# Patient Record
Sex: Female | Born: 1935
Health system: Southern US, Community
[De-identification: ages and names within clinical notes are randomized; demographics above are authoritative.]

## PROBLEM LIST (undated history)

## (undated) DIAGNOSIS — F419 Anxiety disorder, unspecified: Secondary | ICD-10-CM

## (undated) DIAGNOSIS — I2510 Atherosclerotic heart disease of native coronary artery without angina pectoris: Secondary | ICD-10-CM

## (undated) DIAGNOSIS — E039 Hypothyroidism, unspecified: Secondary | ICD-10-CM

## (undated) DIAGNOSIS — K469 Unspecified abdominal hernia without obstruction or gangrene: Secondary | ICD-10-CM

## (undated) DIAGNOSIS — K862 Cyst of pancreas: Secondary | ICD-10-CM

## (undated) DIAGNOSIS — N189 Chronic kidney disease, unspecified: Secondary | ICD-10-CM

## (undated) DIAGNOSIS — M5430 Sciatica, unspecified side: Secondary | ICD-10-CM

## (undated) DIAGNOSIS — I10 Essential (primary) hypertension: Secondary | ICD-10-CM

## (undated) DIAGNOSIS — R519 Headache, unspecified: Secondary | ICD-10-CM

## (undated) DIAGNOSIS — M1990 Unspecified osteoarthritis, unspecified site: Secondary | ICD-10-CM

## (undated) HISTORY — PX: WHIPPLE PROCEDURE: SHX2667

## (undated) HISTORY — DX: Atherosclerotic heart disease of native coronary artery without angina pectoris: I25.10

## (undated) HISTORY — PX: HERNIA REPAIR: SHX51

## (undated) HISTORY — DX: Cyst of pancreas: K86.2

## (undated) HISTORY — PX: HAND SURGERY: SHX662

## (undated) HISTORY — PX: FOOT SURGERY: SHX648

## (undated) HISTORY — DX: Sciatica, unspecified side: M54.30

---

## 1998-03-07 ENCOUNTER — Ambulatory Visit (HOSPITAL_COMMUNITY): Admission: RE | Admit: 1998-03-07 | Discharge: 1998-03-07 | Payer: Self-pay | Admitting: *Deleted

## 1998-03-25 ENCOUNTER — Other Ambulatory Visit: Admission: RE | Admit: 1998-03-25 | Discharge: 1998-03-25 | Payer: Self-pay | Admitting: *Deleted

## 1998-03-27 ENCOUNTER — Ambulatory Visit (HOSPITAL_COMMUNITY): Admission: RE | Admit: 1998-03-27 | Discharge: 1998-03-27 | Payer: Self-pay | Admitting: Family Medicine

## 1999-03-07 ENCOUNTER — Other Ambulatory Visit: Admission: RE | Admit: 1999-03-07 | Discharge: 1999-03-07 | Payer: Self-pay | Admitting: *Deleted

## 1999-04-01 ENCOUNTER — Encounter: Payer: Self-pay | Admitting: *Deleted

## 1999-04-01 ENCOUNTER — Ambulatory Visit (HOSPITAL_COMMUNITY): Admission: RE | Admit: 1999-04-01 | Discharge: 1999-04-01 | Payer: Self-pay | Admitting: *Deleted

## 1999-07-03 ENCOUNTER — Other Ambulatory Visit: Admission: RE | Admit: 1999-07-03 | Discharge: 1999-07-03 | Payer: Self-pay | Admitting: *Deleted

## 1999-07-03 ENCOUNTER — Encounter (INDEPENDENT_AMBULATORY_CARE_PROVIDER_SITE_OTHER): Payer: Self-pay

## 2000-01-19 ENCOUNTER — Encounter: Admission: RE | Admit: 2000-01-19 | Discharge: 2000-04-18 | Payer: Self-pay | Admitting: Anesthesiology

## 2000-03-23 ENCOUNTER — Other Ambulatory Visit: Admission: RE | Admit: 2000-03-23 | Discharge: 2000-03-23 | Payer: Self-pay | Admitting: *Deleted

## 2000-03-24 ENCOUNTER — Encounter: Payer: Self-pay | Admitting: *Deleted

## 2000-03-24 ENCOUNTER — Encounter: Admission: RE | Admit: 2000-03-24 | Discharge: 2000-03-24 | Payer: Self-pay | Admitting: *Deleted

## 2000-04-01 ENCOUNTER — Encounter: Payer: Self-pay | Admitting: *Deleted

## 2000-04-01 ENCOUNTER — Ambulatory Visit (HOSPITAL_COMMUNITY): Admission: RE | Admit: 2000-04-01 | Discharge: 2000-04-01 | Payer: Self-pay | Admitting: *Deleted

## 2001-01-18 ENCOUNTER — Ambulatory Visit (HOSPITAL_BASED_OUTPATIENT_CLINIC_OR_DEPARTMENT_OTHER): Admission: RE | Admit: 2001-01-18 | Discharge: 2001-01-19 | Payer: Self-pay | Admitting: Orthopedic Surgery

## 2001-03-24 ENCOUNTER — Other Ambulatory Visit: Admission: RE | Admit: 2001-03-24 | Discharge: 2001-03-24 | Payer: Self-pay | Admitting: *Deleted

## 2001-04-21 ENCOUNTER — Ambulatory Visit (HOSPITAL_COMMUNITY): Admission: RE | Admit: 2001-04-21 | Discharge: 2001-04-21 | Payer: Self-pay | Admitting: *Deleted

## 2001-04-21 ENCOUNTER — Encounter: Payer: Self-pay | Admitting: *Deleted

## 2002-03-27 ENCOUNTER — Other Ambulatory Visit: Admission: RE | Admit: 2002-03-27 | Discharge: 2002-03-27 | Payer: Self-pay | Admitting: Obstetrics & Gynecology

## 2002-04-24 ENCOUNTER — Ambulatory Visit (HOSPITAL_COMMUNITY): Admission: RE | Admit: 2002-04-24 | Discharge: 2002-04-24 | Payer: Self-pay | Admitting: *Deleted

## 2002-04-24 ENCOUNTER — Encounter: Payer: Self-pay | Admitting: *Deleted

## 2002-06-26 ENCOUNTER — Encounter: Payer: Self-pay | Admitting: Emergency Medicine

## 2002-06-26 ENCOUNTER — Emergency Department (HOSPITAL_COMMUNITY): Admission: EM | Admit: 2002-06-26 | Discharge: 2002-06-26 | Payer: Self-pay | Admitting: Emergency Medicine

## 2003-03-29 ENCOUNTER — Other Ambulatory Visit: Admission: RE | Admit: 2003-03-29 | Discharge: 2003-03-29 | Payer: Self-pay | Admitting: Obstetrics and Gynecology

## 2003-05-09 ENCOUNTER — Ambulatory Visit (HOSPITAL_COMMUNITY): Admission: RE | Admit: 2003-05-09 | Discharge: 2003-05-09 | Payer: Self-pay | Admitting: *Deleted

## 2003-05-09 ENCOUNTER — Encounter: Payer: Self-pay | Admitting: *Deleted

## 2004-04-14 ENCOUNTER — Ambulatory Visit (HOSPITAL_COMMUNITY): Admission: RE | Admit: 2004-04-14 | Discharge: 2004-04-14 | Payer: Self-pay | Admitting: *Deleted

## 2004-05-09 ENCOUNTER — Ambulatory Visit (HOSPITAL_COMMUNITY): Admission: RE | Admit: 2004-05-09 | Discharge: 2004-05-09 | Payer: Self-pay | Admitting: *Deleted

## 2005-05-11 ENCOUNTER — Ambulatory Visit (HOSPITAL_COMMUNITY): Admission: RE | Admit: 2005-05-11 | Discharge: 2005-05-11 | Payer: Self-pay | Admitting: *Deleted

## 2006-05-13 ENCOUNTER — Ambulatory Visit (HOSPITAL_COMMUNITY): Admission: RE | Admit: 2006-05-13 | Discharge: 2006-05-13 | Payer: Self-pay | Admitting: *Deleted

## 2007-02-17 ENCOUNTER — Encounter (INDEPENDENT_AMBULATORY_CARE_PROVIDER_SITE_OTHER): Payer: Self-pay | Admitting: *Deleted

## 2007-02-17 ENCOUNTER — Ambulatory Visit (HOSPITAL_COMMUNITY): Admission: RE | Admit: 2007-02-17 | Discharge: 2007-02-17 | Payer: Self-pay | Admitting: Gastroenterology

## 2007-02-17 ENCOUNTER — Encounter: Payer: Self-pay | Admitting: Gastroenterology

## 2007-03-02 ENCOUNTER — Ambulatory Visit: Payer: Self-pay | Admitting: Gastroenterology

## 2007-05-16 ENCOUNTER — Ambulatory Visit (HOSPITAL_COMMUNITY): Admission: RE | Admit: 2007-05-16 | Discharge: 2007-05-16 | Payer: Self-pay | Admitting: Family Medicine

## 2007-09-09 ENCOUNTER — Emergency Department (HOSPITAL_COMMUNITY): Admission: EM | Admit: 2007-09-09 | Discharge: 2007-09-09 | Payer: Self-pay | Admitting: Emergency Medicine

## 2008-05-16 ENCOUNTER — Ambulatory Visit (HOSPITAL_COMMUNITY): Admission: RE | Admit: 2008-05-16 | Discharge: 2008-05-16 | Payer: Self-pay | Admitting: Family Medicine

## 2008-06-27 ENCOUNTER — Encounter: Admission: RE | Admit: 2008-06-27 | Discharge: 2008-06-27 | Payer: Self-pay | Admitting: Family Medicine

## 2008-11-30 ENCOUNTER — Encounter: Admission: RE | Admit: 2008-11-30 | Discharge: 2008-11-30 | Payer: Self-pay | Admitting: Family Medicine

## 2009-05-08 ENCOUNTER — Encounter: Admission: RE | Admit: 2009-05-08 | Discharge: 2009-05-08 | Payer: Self-pay | Admitting: Family Medicine

## 2009-05-08 ENCOUNTER — Encounter: Payer: Self-pay | Admitting: Gastroenterology

## 2009-05-17 ENCOUNTER — Ambulatory Visit (HOSPITAL_COMMUNITY): Admission: RE | Admit: 2009-05-17 | Discharge: 2009-05-17 | Payer: Self-pay | Admitting: Family Medicine

## 2009-06-18 ENCOUNTER — Telehealth (INDEPENDENT_AMBULATORY_CARE_PROVIDER_SITE_OTHER): Payer: Self-pay | Admitting: *Deleted

## 2009-07-23 ENCOUNTER — Encounter (INDEPENDENT_AMBULATORY_CARE_PROVIDER_SITE_OTHER): Payer: Self-pay | Admitting: *Deleted

## 2009-07-23 ENCOUNTER — Ambulatory Visit: Payer: Self-pay | Admitting: Gastroenterology

## 2009-07-23 DIAGNOSIS — R933 Abnormal findings on diagnostic imaging of other parts of digestive tract: Secondary | ICD-10-CM | POA: Insufficient documentation

## 2009-07-31 ENCOUNTER — Encounter: Admission: RE | Admit: 2009-07-31 | Discharge: 2009-07-31 | Payer: Self-pay | Admitting: Family Medicine

## 2009-08-08 ENCOUNTER — Encounter: Payer: Self-pay | Admitting: Gastroenterology

## 2009-08-08 ENCOUNTER — Ambulatory Visit: Payer: Self-pay | Admitting: Gastroenterology

## 2009-08-08 ENCOUNTER — Ambulatory Visit (HOSPITAL_COMMUNITY): Admission: RE | Admit: 2009-08-08 | Discharge: 2009-08-08 | Payer: Self-pay | Admitting: Gastroenterology

## 2009-08-26 ENCOUNTER — Ambulatory Visit: Payer: Self-pay | Admitting: Gastroenterology

## 2009-09-12 ENCOUNTER — Encounter: Payer: Self-pay | Admitting: Gastroenterology

## 2009-10-03 ENCOUNTER — Telehealth (INDEPENDENT_AMBULATORY_CARE_PROVIDER_SITE_OTHER): Payer: Self-pay | Admitting: *Deleted

## 2009-10-08 ENCOUNTER — Encounter: Payer: Self-pay | Admitting: Gastroenterology

## 2010-06-06 ENCOUNTER — Ambulatory Visit (HOSPITAL_COMMUNITY): Admission: RE | Admit: 2010-06-06 | Discharge: 2010-06-06 | Payer: Self-pay | Admitting: Obstetrics

## 2010-07-08 ENCOUNTER — Encounter: Payer: Self-pay | Admitting: Gastroenterology

## 2010-08-13 ENCOUNTER — Encounter: Payer: Self-pay | Admitting: Gastroenterology

## 2010-08-31 ENCOUNTER — Encounter: Payer: Self-pay | Admitting: Gastroenterology

## 2010-09-12 ENCOUNTER — Encounter: Payer: Self-pay | Admitting: Gastroenterology

## 2010-10-21 ENCOUNTER — Encounter: Payer: Self-pay | Admitting: Gastroenterology

## 2010-10-26 ENCOUNTER — Encounter: Payer: Self-pay | Admitting: Family Medicine

## 2010-10-30 ENCOUNTER — Emergency Department (HOSPITAL_BASED_OUTPATIENT_CLINIC_OR_DEPARTMENT_OTHER)
Admission: EM | Admit: 2010-10-30 | Discharge: 2010-10-30 | Payer: Self-pay | Source: Home / Self Care | Admitting: Emergency Medicine

## 2010-10-30 LAB — COMPREHENSIVE METABOLIC PANEL
ALT: 31 U/L (ref 0–35)
AST: 30 U/L (ref 0–37)
Albumin: 3.9 g/dL (ref 3.5–5.2)
Alkaline Phosphatase: 97 U/L (ref 39–117)
BUN: 19 mg/dL (ref 6–23)
CO2: 27 mEq/L (ref 19–32)
Calcium: 9.6 mg/dL (ref 8.4–10.5)
Chloride: 99 mEq/L (ref 96–112)
Creatinine, Ser: 0.8 mg/dL (ref 0.4–1.2)
GFR calc Af Amer: >60 mL/min (ref >60)
GFR calc non Af Amer: >60 mL/min (ref >60)
Glucose, Bld: 110 mg/dL — ABNORMAL HIGH (ref 70–99)
Potassium: 4.2 mEq/L (ref 3.5–5.1)
Sodium: 136 mEq/L (ref 135–145)
Total Bilirubin: 0.4 mg/dL (ref 0.3–1.2)
Total Protein: 7.1 g/dL (ref 6.0–8.3)

## 2010-10-30 LAB — CBC
HCT: 32 % — ABNORMAL LOW (ref 36.0–46.0)
Hemoglobin: 10.6 g/dL — ABNORMAL LOW (ref 12.0–15.0)
MCH: 27.6 pg (ref 26.0–34.0)
MCHC: 33.1 g/dL (ref 30.0–36.0)
MCV: 83.3 fL (ref 78.0–100.0)
Platelets: 283 10*3/uL (ref 150–400)
RBC: 3.84 MIL/uL — ABNORMAL LOW (ref 3.87–5.11)
RDW: 14.1 % (ref 11.5–15.5)
WBC: 9.9 10*3/uL (ref 4.0–10.5)

## 2010-10-30 LAB — URINALYSIS, ROUTINE W REFLEX MICROSCOPIC
Bilirubin Urine: NEGATIVE
Hgb urine dipstick: NEGATIVE
Ketones, ur: NEGATIVE mg/dL
Nitrite: NEGATIVE
Protein, ur: NEGATIVE mg/dL
Specific Gravity, Urine: 1.011 (ref 1.005–1.030)
Urine Glucose, Fasting: NEGATIVE mg/dL
Urobilinogen, UA: 0.2 mg/dL (ref 0.0–1.0)
pH: 6.5 (ref 5.0–8.0)

## 2010-10-30 LAB — DIFFERENTIAL
Basophils Absolute: 0 10*3/uL (ref 0.0–0.1)
Basophils Relative: 0 % (ref 0–1)
Eosinophils Absolute: 0.1 10*3/uL (ref 0.0–0.7)
Eosinophils Relative: 1 % (ref 0–5)
Lymphocytes Relative: 27 % (ref 12–46)
Lymphs Abs: 2.7 10*3/uL (ref 0.7–4.0)
Monocytes Absolute: 0.7 10*3/uL (ref 0.1–1.0)
Monocytes Relative: 7 % (ref 3–12)
Neutro Abs: 6.3 10*3/uL (ref 1.7–7.7)
Neutrophils Relative %: 64 % (ref 43–77)

## 2010-10-30 LAB — LIPASE, BLOOD: Lipase: 73 U/L (ref 23–300)

## 2010-11-04 NOTE — Procedures (Signed)
Summary: Endoscopic Ultrasound  Patient: Denise Hoffman Note: All result statuses are Final unless otherwise noted.  Tests: (1) Endoscopic Ultrasound (EUS)  EUS Endoscopic Ultrasound                             DONE     [facility]     [address] [place], [place]  [address]           ENDOSCOPIC ULTRASOUND PROCEDURE REPORT           PATIENT:  Denise Hoffman, Denise Hoffman  MR#:  [id number]     BIRTHDATE:  1935-10-14  GENDER:  female           ENDOSCOPIST:  Rachael Fee, MD     REFERRED BY:           PROCEDURE DATE:  08/08/2009     PROCEDURE:  Upper EUS w/FNA     ASA CLASS:  Class II     INDICATIONS:  Pancreatic cyst, found incidentally when undergoing     imaging for AAA (4.8cm by 2.8 cm cyst). EUS 2008:Single bilobed     cyst in region of head/uncinate pancreas measuring 2.8cm     maximally. There were no septations and no internal debris. No     associated solid masses. FNA fluid analysis CEA 262, Amylase     13,000, cytology negative. August 2010 CT scan for right lower     quadrant pain found a cyst, possibly very slight increase in size     in two-year interval           MEDICATIONS:   Fentanyl 100 mcg IV, Versed 8 mg IV, Cipro 400mg  IV           DESCRIPTION OF PROCEDURE:   After the risks benefits and     alternatives of the procedure were  explained, informed consent     was obtained. The patient was then placed in the left, lateral,     decubitus postion and IV sedation was administered. Throughout the     procedure, the patient's blood pressure, pulse and oxygen     saturations were monitored continuously.  Under direct     visualization, the [id number] ([id number]) endoscope was introduced     through the mouth and advanced to the duodenum.  Water was used as     necessary to provide an acoustic interface.  Upon completion of     the imaging, water was removed and the patient was sent to the     recovery room in satisfactory condition.        Endoscopic findings:     1. Normal esophagus     2. Normal stomach     3. Normal duodenum           EUS findings:     1. Bilobed cyst in head/uncinate pancreas  that measured 5cm in     maximum dimension. The cyst had a single thin septation but was     otherwise anechoic There were no obvious associated nodules or     solid masses.  The uncinate branch of pancreatic duct may     communicate with the cyst, but this is not ideally visualized.     The cyst fluid was aspirated using a single pass of a 22 gauge Echo     Tip EUS FNA needle.  FNA yielded 9cc of clear, thin fluid.  2. Pancreatic parenchyma was otherwise normal throughout gland.     3. Main pancreatic duct was normal, non-dilated     4. CBD was normal, non-dilated and without filling defects     5. Gallbladder was normal     6. Limited views of liver, spleen, portal and splenic vessels were     all normal.           Impression:     5cm, bilobed cyst in head/uncinate pancreas.  Although it is 5cm,     this cyst has no alarm features such as associated nodules,     masses, pancreatic or biliary duct dilation.  9cc of fluid was     aspirated from the cyst, sent for CEA, amylase and cytology.     Final recommendations pending the results of cyst fluid testing.     She will complete 3 days of twice daily cipro.           ______________________________     Rachael Fee, MD           cc: Catha Gosselin, MD;  Dr. Algie Coffer           n.     eSIGNED:   Rachael Fee at 08/08/2009 08:20 AM           Vito Berger, [id number]  Note: An exclamation mark (!) indicates a result that was not dispersed into the flowsheet. Document Creation Date: 08/08/2009 8:20 AM _______________________________________________________________________  (1) Order result status: Final Collection or observation date-time: 08/08/2009 07:45 Requested date-time:  Receipt date-time:  Reported date-time:  Referring Physician:   Ordering  Physician: Rob Bunting [phone]) Specimen Source:  Source: Launa Grill Order Number: [id number] Lab site:

## 2010-11-04 NOTE — Procedures (Signed)
Summary: ENDO/EUS prep/[facility]  ENDO/EUS prep/ [facility]   Imported By: Lester [place] 07/25/2009 08:13:08  _____________________________________________________________________  External Attachment:    Type:   Image     Comment:   External Document

## 2010-11-04 NOTE — Letter (Signed)
Summary: General Surgery/[facility]  General Surgery/[facility]   Imported By: Lester [place] 07/17/2010 09:18:19  _____________________________________________________________________  External Attachment:    Type:   Image     Comment:   External Document  Appended Document: General Surgery/[facility] please call her.  she needs rov with me to discuss colonoscopy that was recommended by Dr. Jola Babinski at [facility] (for elevated CEA level)  Appended Document: General Surgery/[facility] Dr Bosie Clos did the Colon on Tues.

## 2010-11-04 NOTE — Progress Notes (Signed)
Summary: Appt  Phone Note Outgoing Call Call back at [facility] Phone [phone]   Call placed by: Chales Abrahams CMA Duncan Dull),  June 18, 2009 4:38 PM Summary of Call: Called and scheduled appt with pt to discuss CT scan results with Dr Christella Hartigan Initial call taken by: Chales Abrahams CMA Duncan Dull),  June 18, 2009 4:38 PM

## 2010-11-04 NOTE — Consult Note (Signed)
Summary: [facility]   Imported By: Lester [place] 09/26/2009 08:05:51  _____________________________________________________________________  External Attachment:    Type:   Image     Comment:   External Document

## 2010-11-04 NOTE — Letter (Signed)
Summary: Gen Surgery Clinic [facility]  Gen Surgery Clinic Note/[facility]   Imported By: Sherian Rein 10/17/2009 09:11:38  _____________________________________________________________________  External Attachment:    Type:   Image     Comment:   External Document

## 2010-11-04 NOTE — Progress Notes (Signed)
Summary: Records faxed to [facility]  Records faxed to [facility] [phone] Attn: Ms. Cline Crock. 11 pages sent for patient's appointment on Monday January 3,2011. Dena Chavis  October 03, 2009 11:25 AM

## 2010-11-04 NOTE — Procedures (Signed)
Summary: EUS   EUS  Procedure date:  02/17/2007  Findings:      Location: [facility]   Patient Name: Denise Hoffman, Denise Hoffman MRN: [id number] Procedure Procedures: Panendoscopy with EUSCPT: 43259.   with Fine Needle Aspiration Personnel: Endoscopist: Rachael Fee, MD.  Referred By: Catha Gosselin, MD.  Exam Location: Exam performed in Endoscopy Suite. Outpatient  Patient Consent: Procedure, Alternatives, Risks and Benefits discussed, consent obtained, from patient. Consent was obtained by the RN.  Indications  Assessment: incidentally found cyst in head/uncinate pancreas (ultrasound initially done to check for AAA), no solid mass in pancreas  on CT.  History  Current Medications: Patient is not currently taking Coumadin.  Pre-Exam Physical: Performed Feb 17, 2007. Cardio-pulmonary exam, Abdominal exam, Mental status exam WNL.  Exam Exam: Images were taken.  Patient: ASA Classification: II. Tolerance: good.  Sedation Meds:  ~OBJECTIVE5Sedation Meds Patient assessed and found to be appropriate for moderate (conscious) sedation. Fentanyl 75 mcg. given IV. Versed 8 mg. given IV. Cetacaine Spray 2 sprays given aerosolized. Cipro 400 given IV.  Monitoring: BP and pulse monitoring done. Oximetry was used. Supplemental O2 given.  EUS Scopes: Radial Echoendoscope used Curvilinear Array Echoendoscope used   Comments: Endoscopic examination: 1. Normal esophagus. 2. Normal stomach. 3. Normal duodenum.  EUS examination: 1. Single bilobed cyst in region of head/uncinate pancreas measuring 2.8cm maximally.  There were no septations and no internal debris.  No associated solid masses.  This was sampled with 2 passes of 22guage Echo Tip EUS FNA needle using color Doppler to avoid vessels.  10cc clear, thin fluid was removed and sent for cytology, CEA and amylase.  The cyst was nearly completely aspirated. 2. Pancreatic parenchyma was normal throughout head, neck,  body and tail. 3. CBD was normal; non-dilated and without filling defects. 4. Main pancreatic duct was normal. 5. Gallbladder was normal. 6. No peripancreatic or celiac adenopathy. 7. Limited views of liver, spleen, splenic vessels, portal vessels were all normal. Assessment  Biliary/Pancreatic Abnormal examination, see findings above.  Comments: Single bilobed cyst in head/uncinate pancreas, aspirated by FNA.  10cc clear, thin fluid sent for CEA, amylase, cytology.  I suspect this is a serous cystadenoma, a condition without malignant potential. Events  Unplanned Intervention: No intervention was required.  Unplanned Events: There were no complications. Plans Comments: Await fluid analysis.  She will complete a 3 day course of cipro 500mg  BID to decrease risk of infection. This report was created from the original endoscopy report, which was reviewed and signed by the above listed endoscopist.

## 2010-11-04 NOTE — Letter (Signed)
Summary: [facility]  [facility]   Imported By: Sherian Rein 10/22/2009 10:27:53  _____________________________________________________________________  External Attachment:    Type:   Image     Comment:   External Document

## 2010-11-04 NOTE — Letter (Signed)
Summary: EGD Instructions  [facility]  [address] [place], [place] [address]   Phone: [phone]  Fax: [phone]       Denise Hoffman    [birth year]    MRN: [id number]       Procedure Day /Date:08/08/2009     Arrival Time: 630 am     Procedure Time:730 am     Location of Procedure:                     _X  _ [facility] ( Outpatient Registration)   PREPARATION FOR ENDOSCOPY   On11/4/10 THE DAY OF THE PROCEDURE:  1.   No solid foods, milk or milk products are allowed after midnight the night before your procedure.  2.   Do not drink anything colored red or purple.  Avoid juices with pulp.  No orange juice.  3.  You may drink clear liquids until 530 am , which is 2 hours before your procedure.                                                                                                CLEAR LIQUIDS INCLUDE: Water Jello Ice Popsicles Tea (sugar ok, no milk/cream) Powdered fruit flavored drinks Coffee (sugar ok, no milk/cream) Gatorade Juice: apple, white grape, white cranberry  Lemonade Clear bullion, consomm, broth Carbonated beverages (any kind) Strained chicken noodle soup Hard Candy   MEDICATION INSTRUCTIONS  Unless otherwise instructed, you should take regular prescription medications with a small sip of water as early as possible the morning of your procedure.  Diabetic patients - see separate instructions.               OTHER INSTRUCTIONS  You will need a responsible adult at least 73 years of age to accompany you and drive you home.   This person must remain in the waiting room during your procedure.  Wear loose fitting clothing that is easily removed.  Leave jewelry and other valuables at home.  However, you may wish to bring a book to read or an iPod/MP3 player to listen to music as you wait for your procedure to start.  Remove all body piercing jewelry and leave at home.  Total time from sign-in until  discharge is approximately 2-3 hours.  You should go home directly after your procedure and rest.  You can resume normal activities the day after your procedure.  The day of your procedure you should not:   Drive   Make legal decisions   Operate machinery   Drink alcohol   Return to work  You will receive specific instructions about eating, activities and medications before you leave.    The above instructions have been reviewed and explained to me by   _______________________    I fully understand and can verbalize these instructions _____________________________ Date 07/23/09

## 2010-11-04 NOTE — Miscellaneous (Signed)
Summary: rx  Clinical Lists Changes  Medications: Added new medication of CIPROFLOXACIN HCL 500 MG  TABS (CIPROFLOXACIN HCL) Take 1 twice a day for 3 days - Signed Rx of CIPROFLOXACIN HCL 500 MG  TABS (CIPROFLOXACIN HCL) Take 1 twice a day for 3 days;  #6 x 0;  Signed;  Entered by: Rachael Fee MD;  Authorized by: Rachael Fee MD;  Method used: Print then Give to Patient    Prescriptions: CIPROFLOXACIN HCL 500 MG  TABS (CIPROFLOXACIN HCL) Take 1 twice a day for 3 days  #6 x 0   Entered and Authorized by:   Rachael Fee MD   Signed by:   Rachael Fee MD on 08/08/2009   Method used:   Print then Give to Patient   RxID:   [id number]

## 2010-11-04 NOTE — Assessment & Plan Note (Signed)
Review of gastrointestinal problems: 1. Pancreatic cyst, found incidentally when undergoing imaging for AAA (4.8cm by 2.8 cm cyst).  EUS 2008:Single bilobed cyst in region of head/uncinate pancreas measuring 2.8cm maximally.  There were no septations and no internal debris. No associated solid masses.  FNA fluid analysis  CEA 262, Amylase 13,000, cytology negative... August 2010 CT scan for right lower quadrant pain found a cyst, possibly very slight increase in size in two-year interval.    October, 2010: Repeat endoscopic ultrasound with fine needle aspiration: morphologically the same however CEA was 584 ng/mL (high), amylase was 34 U/L (low), cytology negative.   History of Present Illness Visit Type: Follow-up Visit Primary GI MD: Rob Bunting MD Primary Provider: Catha Gosselin, MD Chief Complaint: 1-2 week follow-up History of Present Illness:      very pleasant 75 year old woman whom I last saw at the time of her endoscopic ultrasound2-3 weeks ago. The cyst fluid analysis came back pointing towards this cyst being a precancerous lesion, specifically the CEA level was quite high. See those results summarized above. She is here with her daughter and husband and we discussed referring her to a pancreatic surgeon to consider Whipple procedure. She is otherwise pretty healthy and I think she could withstand such a procedure.           Current Medications (verified): 1)  Metoprolol Succinate 200 Mg Xr24h-Tab (Metoprolol Succinate) .Marland Kitchen.. 1 By Mouth Two Times A Day 2)  Hydrochlorothiazide 25 Mg Tabs (Hydrochlorothiazide) .Marland Kitchen.. 1 Tablet By Mouth Once Daily 3)  Amitiza 24 Mcg Caps (Lubiprostone) .Marland Kitchen.. 1 Capsule By Mouth Once Daily 4)  Cozaar 50 Mg Tabs (Losartan Potassium) .Marland Kitchen.. 1 Tablet By Mouth Once Daily \\par  5)  Alprazolam 0.5 Mg Tabs (Alprazolam) .... Take By Mouth As Needed 6)  Zolpidem Tartrate 10 Mg Tabs (Zolpidem Tartrate) .... 1/2 Tablet By Mouth As Needed At Bedtime 7)  Calcium 1500  Mg Tabs (Calcium Carbonate) .Marland Kitchen.. 1 Tablet By Mouth By Mouth Two Times A Day 8)  Vitamin D 1000 Unit Tabs (Cholecalciferol) .Marland Kitchen.. 1 Tablet By Mouth Once Daily 9)  Aspirin 81 Mg Tbec (Aspirin) .Marland Kitchen.. 1 Tablet By Mouth Once Daily 10)  Hyomax-Sr 0.375 Mg Xr12h-Tab (Hyoscyamine Sulfate) .... Take 2 Daily As Needed 11)  Estrace 1 Mg Tabs (Estradiol) .Marland Kitchen.. 1 Tablet By Mouth Once Daily  Allergies (verified): 1)  ! Augmentin 2)  ! Sulfa 3)  ! Morphine 4)  ! Prednisone  Vital Signs:  Patient profile:   75 year old female Height:      63.5 inches Weight:      141.13 pounds BMI:     24.70 Pulse rate:   68 / minute Pulse rhythm:   regular BP sitting:   150 / 84  (left arm)  Vitals Entered By: Milford Cage NCMA (August 26, 2009 10:26 AM)  Physical Exam  Additional Exam:  Constitutional: generally well appearing Psychiatric: alert and oriented times 3 Abdomen: soft, non-tender, non-distended, normal bowel sounds    Impression & Recommendations:  Problem # 1:  Pancreatic cyst we will refer her to Dr. Donell Beers at [facility] to consider Whipple resection of this likely precancerous pancreatic cyst.  Patient Instructions: 1)  We will refer you to Dr. Almond Lint at [facility] to consider surgery (whipple) for the pancreatic cystic lesion in head of pancras. 2)  A copy of this information will be sent to Dr. Berniece Salines, [facility], [place]. 3)  The medication list was reviewed and reconciled.  All changed /  newly prescribed medications were explained.  A complete medication list was provided to the patient / caregiver.

## 2010-11-04 NOTE — Assessment & Plan Note (Signed)
Review of gastrointestinal problems: 1. Pancreatic cyst, found incidentally when undergoing imaging for AAA (4.8cm by 2.8 cm cyst).  EUS 2008:Single bilobed cyst in region of head/uncinate pancreas measuring 2.8cm maximally.  There were no septations and no internal debris. No associated solid masses.  FNA fluid analysis  CEA 262, Amylase 13,000, cytology negative... August 2010 CT scan for right lower quadrant pain found a cyst, possibly very slight increase in size in two-year interval    History of Present Illness Visit Type: Follow-up Visit Primary GI MD: Rob Bunting MD Chief Complaint: review CT results History of Present Illness:     very pleasant 75 year old woman whom I last saw over 2 years ago for endoscopic ultrasound of a pancreatic cyst that was incidentally found. See those results above. She has a tough time with constipation and IBS-like discomforts. She had recent right lower quadrant discomfort, this was evaluated with a CT scan. That CT scan showed a cystocele, also the pancreatic cyst in the uncinate process of the pancreas was seen again and possibly have grown slightly in size. It now measured up to 2.9 x 4.9 cm. She has no pancreatitis-like discomforts           Current Medications (verified): 1)  Metoprolol Succinate 200 Mg Xr24h-Tab (Metoprolol Succinate) .Marland Kitchen.. 1 By Mouth Two Times A Day 2)  Hydrochlorothiazide 25 Mg Tabs (Hydrochlorothiazide) .Marland Kitchen.. 1 Tablet By Mouth Once Daily 3)  Amitiza 24 Mcg Caps (Lubiprostone) .Marland Kitchen.. 1 Capsule By Mouth Once Daily 4)  Cozaar 50 Mg Tabs (Losartan Potassium) .Marland Kitchen.. 1 Tablet By Mouth Once Daily \\par  5)  Alprazolam 0.5 Mg Tabs (Alprazolam) .... Take By Mouth As Needed 6)  Zolpidem Tartrate 10 Mg Tabs (Zolpidem Tartrate) .... 1/2 Tablet By Mouth As Needed At Bedtime 7)  Calcium 1500 Mg Tabs (Calcium Carbonate) .Marland Kitchen.. 1 Tablet By Mouth By Mouth Two Times A Day 8)  Vitamin D 1000 Unit Tabs (Cholecalciferol) .Marland Kitchen.. 1 Tablet By Mouth Once  Daily 9)  Aspirin 81 Mg Tbec (Aspirin) .Marland Kitchen.. 1 Tablet By Mouth Once Daily  Allergies (verified): 1)  ! Augmentin 2)  ! Sulfa 3)  ! Morphine 4)  ! Prednisone  Vital Signs:  Patient profile:   75 year old female Height:      63.5 inches Weight:      144 pounds BMI:     25.20 Pulse rate:   60 / minute Pulse rhythm:   regular BP sitting:   126 / 84  (left arm)  Vitals Entered By: Milford Cage NCMA (July 23, 2009 8:37 AM)  Physical Exam  Additional Exam:  Constitutional: generally well appearing Psychiatric: alert and oriented times 3 Abdomen: soft, non-tender, non-distended, normal bowel sounds    Impression & Recommendations:  Problem # 1:  pancreatic cyst I do not think that her pancreatic cyst has anything to do with her right lower quadrant discomforts. Cyst fluid analysis from 2008 was somewhat equivocal with a slightly elevated CEA in the upper 200s but a very elevated amylase. Cytology was negative. I think repeating cyst fluid analysis with endoscopic ultrasound and fine-needle aspiration is a good idea this point. There are more available tests to analyze the fluid today than there were 2 years ago.  my suspicion of underlying malignancy is very low and I do not think that this finding should keep her from having her cystocele fixed operatively.  Patient Instructions: 1)  You will be scheduled for an endoscopic ultrasound procedure (upper EUS, radial +/- linear; 60  min). 2)  A copy of this information will be sent to Dr. Catha Gosselin, Dr. Algie Coffer. 3)  The medication list was reviewed and reconciled.  All changed / newly prescribed medications were explained.  A complete medication list was provided to the patient / caregiver.  Appended Document: Orders Update/EUS    Clinical Lists Changes  Problems: Added new problem of NONSPECIFIC ABN FINDING RAD & OTH EXAM GI TRACT (ICD-793.4) Orders: Added new Test order of EUS-Upper (EUS-Upper) - Signed      Appended  Document: Orders Update/[facility]    Clinical Lists Changes  Orders: Added new Test order of [facility] ([facility]) - Signed

## 2010-11-06 NOTE — Letter (Signed)
Summary: Discharge Summary / [facility]  Discharge Summary / [facility]   Imported By: Lennie Odor 09/17/2010 16:24:12  _____________________________________________________________________  External Attachment:    Type:   Image     Comment:   External Document

## 2010-11-06 NOTE — Letter (Signed)
Summary: General Surgery Clinic Note/[facility]  General Surgery Clinic Note/[facility]   Imported By: Sherian Rein 10/21/2010 11:12:16  _____________________________________________________________________  External Attachment:    Type:   Image     Comment:   External Document

## 2010-11-12 NOTE — Letter (Signed)
Summary: General Surgery Clinic Note/[facility]  General Surgery Clinic Note/[facility]   Imported By: Sherian Rein 11/07/2010 15:08:06  _____________________________________________________________________  External Attachment:    Type:   Image     Comment:   External Document

## 2010-11-12 NOTE — Procedures (Signed)
Summary: Pancreaticoduodenectomy/[facility]  Pancreaticoduodenectomy/[facility]   Imported By: Sherian Rein 11/07/2010 15:17:51  _____________________________________________________________________  External Attachment:    Type:   Image     Comment:   External Document

## 2011-01-07 LAB — MISCELLANEOUS TEST

## 2011-01-07 LAB — AMYLASE, BODY FLUID: Amylase, Fluid: 34 U/L

## 2011-02-20 NOTE — Procedures (Signed)
[facility]  Patient:    Denise Hoffman, Denise Hoffman                 MRN: [id number] Proc. Date: 02/02/00 Adm. Date:  [id number] Attending:  Thyra Breed CC:         John L. Dorothyann Gibbs, M.D.                           Procedure Report  PROCEDURE:  Lumbar epidural steroid injection.  DIAGNOSIS:  Spondylolisthesis with lumbar spinal stenosis.  INTERVAL HISTORY:  The patient has noted marked improvement after her first two injections.  She is here for last in a series of lumbar epidural steroid injections.  EXAMINATION:  Blood pressure was 169/95, heart rate 84, respiratory rate 16, O2 saturations 96%, pain level was 0 out of 10 and temperature was 97.1.  Her back shows good healing from her previous injection site.  Neuro examination is grossly unchanged.  PROCEDURE:  After informed consent was obtained, the patient was placed in the sitting position and monitored.  Her back was prepped with Betadine x 3.  A skin wheal was raised at the L4-5 interspace with 1% lidocaine.  A 20-gauge Tuohy needle was introduced into the lumbar epidural space to a loss of resistance to preservative-free normal saline.  There was no CSF nor blood. Medrol 80 mg and 10 mL preservative-free normal saline was gently injected. The needle was flushed with preservative-free normal saline and removed intact.  POSTPROCEDURAL CONDITION:  Stable.  DISCHARGE INSTRUCTIONS: 1. Resume previous diet. 2. Limitation of activities per instruction sheet. 3. Continue on current medications. 4. The patient plans to follow up with Dr. Priscille Kluver. DD:  02/02/00 TD:  02/02/00 Job: [id number]

## 2011-02-20 NOTE — Op Note (Signed)
. [facility]  Patient:    Denise Hoffman, Denise Hoffman                 MRN: [id number] Proc. Date: 01/18/01 Adm. Date:  [id number] Attending:  Ronne Binning                           Operative Report  PREOPERATIVE DIAGNOSES:  Carpal tunnel syndrome, carpometacarpal arthritis, left hand.  POSTOPERATIVE DIAGNOSES:  Carpal tunnel syndrome, carpometacarpal arthritis, left hand.  PROCEDURES:  Left carpal tunnel release with suspensionplasty, left thumb.  SURGEON:  Nicki Reaper, M.D.  ASSISTANT:  Joaquin Courts, R.N.  ANESTHESIA:  General.  ANESTHESIOLOGIST:  Halford Decamp, M.D.  CLINICAL NOTE:  The patient is a 75 year old female with a history of carpal tunnel syndrome and significant CMC arthritis, not responsive to conservative treatment.  DESCRIPTION OF PROCEDURE:  The patient was brought to the operating room, where a general anesthetic was carried out without difficulty.  She was prepped and draped using Betadine scrub and solution with the left arm free. The limb was exsanguinated with an Esmarch bandage.  The tourniquet placed high in the arm was inflated to 250 mmHg.  A longitudinal incision was made in the palm, carried down through subcutaneous tissue.  Bleeders were electrocauterized.  The superficial palmar arch was identified, the flexor tendon into the ring and little finger identified to the ulnar side of the median nerve.  The carpal retinaculum was incised with sharp dissection.  A right angle and Sewell retractor were placed between skin and forearm fascia. The fascia was released for approximately 3 cm proximal to the wrist crease under direct vision.  The canal was explored, and no further lesions were identified.  The wound was irrigated.  The skin was closed with interrupted 5-0 nylon sutures.  A separate incision was made, a hockey stick over the base of the thumb metacarpal, carried proximally along the first  dorsal compartment, carried down through subcutaneous tissue.  Bleeders were again electrocauterized, neurovascular structures protected, the dissection carried down to the capsule.  The radial artery was identified and protected.  An incision was made in the capsule, elevating it off from the trapezium.  The trapezium was then removed with blunt and sharp dissection and rongeurs in piecemeal fashion, taking care to remove as much bone as possible.  The flexor carpi radialis was identified at the base of the wound.  The dorsal half of the abductor pollicis longus was then harvested through separate incisions but attached at the insertion in the base of the first metacarpal, incised at the musculotendinous junction.  A monofilament wire was then used to remove the dorsal half.  The radial half of the flexor carpi radialis was similarly harvested with separate transverse incisions along the flexor carpi radialis tendon.  Care was taken to protect the radial nerve in the dissection of the abductor pollicis longus and the radial artery in the dissection of the flexor carpi radialis.  These were both left attached to the base of their respective metacarpals.  A drill hole was then placed from the dorsal to palmar central portion of the first metacarpal, enlarged.  The abductor pollicis longus was then rooted along the base of the metacarpal.  A second drill hole was placed in the base of the second metacarpal and exited through a separate incision on the dorsal ulnar aspect of the metacarpal base.  The flexor carpi radialis  was then brought up, passed through the drill hole in the base of the first metacarpal, and looped onto itself with two loops.  It was used to capture the abductor pollicis longus against the base of the second metacarpal.  The abductor pollicis longus was then passed through the drill hole in the base of the second metacarpal, brought out on the dorsal ulnar aspect, and  looped around the flexor carpi radialis, sutured to it with figure-of-eight 3-0 Mersilene sutures after pulling the suspension tight.  The remainder was then passed into the drill hole in the base of the second metacarpal and sutured to itself with figure-of-eight 3-0 Mersilene sutures.  The flexor carpi radialis graft was then tightened over the abductor at the base of the first metacarpal.  This was then sutured to itself and to the abductor pollicis longus at the base.  The remainder of the flexor carpi radialis was then interwoven into the base of the second metacarpal.  The wound was copiously irrigated with saline.  The capsule was then used to redirect it to the base of the second metacarpal.  The radial artery was protected, and the remainder of the capsule was closed, driving the base of the first metacarpal toward the base of the second metacarpal.  This was performed with figure-of-eight 4-0 Vicryl sutures.  The subcutaneous tissue was closed with 4-0 Vicryl, and the skin was closed with interrupted 5-0 nylon sutures for all wounds.  Sterile compressive dressing and thumb spica splint was applied.  The patient tolerated the procedure well and was taken to the recovery room for observation in satisfactory condition.  She is admitted for overnight stay, will be discharged in the morning.  She is admitted for pain control.  She will be discharged on Percocet and Keflex. DD:  01/18/01 TD:  01/18/01 Job: [id number]

## 2011-05-13 ENCOUNTER — Other Ambulatory Visit (HOSPITAL_COMMUNITY): Payer: Self-pay | Admitting: Family Medicine

## 2011-05-13 DIAGNOSIS — Z1231 Encounter for screening mammogram for malignant neoplasm of breast: Secondary | ICD-10-CM

## 2011-06-09 ENCOUNTER — Ambulatory Visit (HOSPITAL_COMMUNITY): Payer: Medicare Other

## 2011-06-22 ENCOUNTER — Ambulatory Visit (HOSPITAL_COMMUNITY)
Admission: RE | Admit: 2011-06-22 | Discharge: 2011-06-22 | Disposition: A | Discharge status: Home / Self Care | Payer: Medicare Other | Source: Ambulatory Visit | Attending: Family Medicine | Admitting: Family Medicine

## 2011-06-22 DIAGNOSIS — Z1231 Encounter for screening mammogram for malignant neoplasm of breast: Secondary | ICD-10-CM | POA: Insufficient documentation

## 2011-07-13 LAB — DIFFERENTIAL
Basophils Absolute: 0
Basophils Relative: 0
Eosinophils Absolute: 0.1 — ABNORMAL LOW
Eosinophils Relative: 2
Lymphocytes Relative: 22
Lymphs Abs: 1.3
Monocytes Absolute: 0.4
Monocytes Relative: 6
Neutro Abs: 4.2
Neutrophils Relative %: 70

## 2011-07-13 LAB — CBC
HCT: 38.3
Hemoglobin: 13.5
MCHC: 35.1
MCV: 89.3
Platelets: 233
RBC: 4.29
RDW: 13.7
WBC: 6

## 2011-07-13 LAB — PROTIME-INR
INR: 0.9
Prothrombin Time: 12.7

## 2011-07-13 LAB — POCT CARDIAC MARKERS
CKMB, poc: 1.5
Myoglobin, poc: 100
Operator id: 4708
Troponin i, poc: <0.05

## 2011-07-13 LAB — COMPREHENSIVE METABOLIC PANEL
ALT: 18
AST: 24
Albumin: 4.1
Alkaline Phosphatase: 98
BUN: 15
CO2: 30
Calcium: 10.3
Chloride: 98
Creatinine, Ser: 0.91
GFR calc Af Amer: >60
GFR calc non Af Amer: >60
Glucose, Bld: 107 — ABNORMAL HIGH
Potassium: 3.7
Sodium: 137
Total Bilirubin: 0.9
Total Protein: 7.3

## 2011-07-13 LAB — APTT: aPTT: 30

## 2011-07-13 LAB — B-NATRIURETIC PEPTIDE (CONVERTED LAB): Pro B Natriuretic peptide (BNP): 87.2

## 2011-07-13 LAB — D-DIMER, QUANTITATIVE: D-Dimer, Quant: 0.26

## 2011-10-23 DIAGNOSIS — M19039 Primary osteoarthritis, unspecified wrist: Secondary | ICD-10-CM | POA: Diagnosis not present

## 2011-11-24 ENCOUNTER — Ambulatory Visit (INDEPENDENT_AMBULATORY_CARE_PROVIDER_SITE_OTHER): Payer: TRICARE For Life (TFL) | Admitting: Gastroenterology

## 2011-11-24 ENCOUNTER — Encounter: Payer: Self-pay | Admitting: Gastroenterology

## 2011-11-24 ENCOUNTER — Other Ambulatory Visit (INDEPENDENT_AMBULATORY_CARE_PROVIDER_SITE_OTHER): Payer: Medicare Other

## 2011-11-24 DIAGNOSIS — R109 Unspecified abdominal pain: Secondary | ICD-10-CM | POA: Diagnosis not present

## 2011-11-24 LAB — CBC WITH DIFFERENTIAL/PLATELET
Basophils Absolute: 0 10*3/uL (ref 0.0–0.1)
Basophils Relative: 0.5 % (ref 0.0–3.0)
Eosinophils Absolute: 0.2 10*3/uL (ref 0.0–0.7)
Eosinophils Relative: 2.5 % (ref 0.0–5.0)
HCT: 38.5 % (ref 36.0–46.0)
Hemoglobin: 12.9 g/dL (ref 12.0–15.0)
Lymphocytes Relative: 27.1 % (ref 12.0–46.0)
Lymphs Abs: 2.2 10*3/uL (ref 0.7–4.0)
MCHC: 33.5 g/dL (ref 30.0–36.0)
MCV: 91.6 fl (ref 78.0–100.0)
Monocytes Absolute: 0.6 10*3/uL (ref 0.1–1.0)
Monocytes Relative: 6.9 % (ref 3.0–12.0)
Neutro Abs: 5.1 10*3/uL (ref 1.4–7.7)
Neutrophils Relative %: 63 % (ref 43.0–77.0)
Platelets: 235 10*3/uL (ref 150.0–400.0)
RBC: 4.2 Mil/uL (ref 3.87–5.11)
RDW: 14.4 % (ref 11.5–14.6)
WBC: 8.1 10*3/uL (ref 4.5–10.5)

## 2011-11-24 LAB — COMPREHENSIVE METABOLIC PANEL
ALT: 20 U/L (ref 0–35)
AST: 21 U/L (ref 0–37)
Albumin: 4.2 g/dL (ref 3.5–5.2)
Alkaline Phosphatase: 86 U/L (ref 39–117)
BUN: 23 mg/dL (ref 6–23)
CO2: 29 mEq/L (ref 19–32)
Calcium: 10.3 mg/dL (ref 8.4–10.5)
Chloride: 98 mEq/L (ref 96–112)
Creatinine, Ser: 1 mg/dL (ref 0.4–1.2)
GFR: 61.19 mL/min (ref >60.00)
Glucose, Bld: 92 mg/dL (ref 70–99)
Potassium: 5.1 mEq/L (ref 3.5–5.1)
Sodium: 134 mEq/L — ABNORMAL LOW (ref 135–145)
Total Bilirubin: 0.5 mg/dL (ref 0.3–1.2)
Total Protein: 7.5 g/dL (ref 6.0–8.3)

## 2011-11-24 NOTE — Progress Notes (Signed)
Review of gastrointestinal problems:  1. Pancreatic cyst, found incidentally when undergoing imaging for AAA (4.8cm by 2.8 cm cyst). EUS 2008:Single bilobed cyst in region of head/uncinate pancreas measuring 2.8cm maximally. There were no septations and no internal debris. No associated solid masses. FNA fluid analysis CEA 262, Amylase 13,000, cytology negative... August 2010 CT scan for right lower quadrant pain found a cyst, possibly very slight increase in size in two-year interval. October, 2010: Repeat endoscopic ultrasound with fine needle aspiration: morphologically the same however CEA was 584 ng/mL (high), amylase was 34 U/L (low), cytology negative.  Eventual Whipple surgery at [facility] on November 2011, Dr. Jola Babinski. 2. adenomatous colon polyps removed October 2011 by Dr. Charlott Rakes from [facility] gastroenterology. I do not have the operative report from that procedure.  I am not aware of the recall that she was advised.  HPI: This is a very pleasant 76 year old woman.  Whom I last saw 3 years ago.   She underwent Whipple surgery 2011.  She keeps getting a pain at the incision site. Lasts 60 seconds or less, has been going on for 2-3 months.  Exactly at the incision site, will double her over.  No associated nausea or vomiting.  Her weight has been stable past 1-2 years.    No nsaids.  She does have intermittent mild pyrosis, much improved after brief course of anitacid meds.  She has not seen Dr. Jola Babinski since the time of the surgery.  Has had no imaging since then.   Past Medical History  Diagnosis Date  . Pancreatic cyst     Past Surgical History  Procedure Date  . Stomach surgery     Current Outpatient Prescriptions  Medication Sig Dispense Refill  . ALPRAZolam (XANAX) 0.5 MG tablet Take 0.5 mg by mouth at bedtime as needed.      Marland Kitchen aspirin 81 MG tablet Take 81 mg by mouth daily.      . calcium citrate-vitamin D (CITRACAL+D) 315-200 MG-UNIT per tablet Take 1 tablet by  mouth 2 (two) times daily.      . cholecalciferol (VITAMIN D) 1000 UNITS tablet Take 1,000 Units by mouth daily.      Marland Kitchen estradiol (ESTRACE) 1 MG tablet Take 1 mg by mouth daily.      . hydrochlorothiazide (HYDRODIURIL) 25 MG tablet Take 25 mg by mouth daily.      . hyoscyamine (LEVBID) 0.375 MG 12 hr tablet Take 0.375 mg by mouth every 12 (twelve) hours as needed.      Marland Kitchen losartan (COZAAR) 50 MG tablet Take 50 mg by mouth daily.      Marland Kitchen lubiprostone (AMITIZA) 24 MCG capsule Take 24 mcg by mouth daily with breakfast.      . metoprolol (TOPROL-XL) 200 MG 24 hr tablet Take 200 mg by mouth 2 (two) times daily.        Allergies as of 11/24/2011 - Review Complete 11/24/2011  Allergen Reaction Noted  . GMW:NUUVOZDGUYQ+IHKVQQVZD+GLOVFIEPPI acid+aspartame    . Morphine    . Prednisone    . Sulfonamide derivatives      Family History  Problem Relation Age of Onset  . Breast cancer Sister   . Stroke Mother   . Colon cancer Brother     History   Social History  . Marital Status: Married    Spouse Name: N/A    Number of Children: 2  . Years of Education: N/A   Occupational History  . Retired    Social History Main  Topics  . Smoking status: Never Smoker   . Smokeless tobacco: Never Used  . Alcohol Use: No  . Drug Use: No  . Sexually Active: Not on file   Other Topics Concern  . Not on file   Social History Narrative  . No narrative on file      Physical Exam: BP 152/80  Pulse 60  Ht 5' 3.5" (1.613 m)  Wt 144 lb 12.8 oz (65.681 kg)  BMI 25.25 kg/m2 Constitutional: generally well-appearing Psychiatric: alert and oriented x3 Abdomen: soft, nontender, nondistended, no obvious ascites, no peritoneal signs, normal bowel sounds, large horizontal incision across upper abdomen, no clear incisional hernias, no specifically tender sites at the incision     Assessment and plan: 76 y.o. female with pain at her Whipple incision from 2011  She has not yet returned to see the  surgeon who performed the surgery. I suspect that her symptom is related to adhesive disease. The pain is very brief however it is pretty severe. Axis trapped nerve, perhaps some intermittently obstructed bowel.  The Whipple surgery was for possibly precancerous cystic disease in her pancreas. She has not had any followup imaging since the surgery. 2 initially workup her pain we will arrange for CT scan abdomen and pelvis. I also do not have the pathology report from her surgery in 2011 we will get in touch with [facility] for surgery to have.

## 2011-11-24 NOTE — Patient Instructions (Addendum)
We will get records from you 2011 whipple surgery at [facility] (pathology report). You will be set up for a CT scan of abdomen and pelvis with IV and oral contrast for intermittent abdominal pain (2011 Whipple for benign disease). You will have labs checked today in the basement lab.  Please head down after you check out with the front desk  (cbc, cmet).  You have been scheduled for a CT scan of the abdomen and pelvis at [facility] ([address]---this is in the same building as Architectural technologist).   You are scheduled on 11/25/11 at 900 am. You should arrive 15 minutes prior to your appointment time for registration. Please follow the written instructions below on the day of your exam:  WARNING: IF YOU ARE ALLERGIC TO IODINE/X-RAY DYE, PLEASE NOTIFY RADIOLOGY IMMEDIATELY AT [phone]! YOU WILL BE GIVEN A 13 HOUR PREMEDICATION PREP.  1) Do not eat or drink anything after 5 am  (4 hours prior to your test) 2) You have been given 2 bottles of oral contrast to drink. The solution may taste better if refrigerated, but do NOT add ice or any other liquid to this solution. Shake well before drinking.    Drink 1 bottle of contrast @ 7 am  (2 hours prior to your exam)  Drink 1 bottle of contrast @ 8 am  (1 hour prior to your exam)  You may take any medications as prescribed with a small amount of water except for the following: Metformin, Glucophage, Glucovance, Avandamet, Riomet, Fortamet, Actoplus Met, Janumet, Glumetza or Metaglip. The above medications must be held the day of the exam AND 48 hours after the exam.  The purpose of you drinking the oral contrast is to aid in the visualization of your intestinal tract. The contrast solution may cause some diarrhea. Before your exam is started, you will be given a small amount of fluid to drink. Depending on your individual set of symptoms, you may also receive an intravenous injection of x-ray contrast/dye. Plan on being at [facility]  for 30 minutes or long, depending on the type of exam you are having performed.  If you have any questions regarding your exam or if you need to reschedule, you may call the CT department at [phone] between the hours of 8:00 am and 5:00 pm, Monday-Friday.  ________________________________________________________________________

## 2011-11-25 ENCOUNTER — Ambulatory Visit (INDEPENDENT_AMBULATORY_CARE_PROVIDER_SITE_OTHER)
Admission: RE | Admit: 2011-11-25 | Discharge: 2011-11-25 | Disposition: A | Discharge status: Home / Self Care | Payer: Medicare Other | Source: Ambulatory Visit | Attending: Gastroenterology | Admitting: Gastroenterology

## 2011-11-25 DIAGNOSIS — N281 Cyst of kidney, acquired: Secondary | ICD-10-CM | POA: Diagnosis not present

## 2011-11-25 DIAGNOSIS — R109 Unspecified abdominal pain: Secondary | ICD-10-CM | POA: Diagnosis not present

## 2011-11-25 DIAGNOSIS — K439 Ventral hernia without obstruction or gangrene: Secondary | ICD-10-CM | POA: Diagnosis not present

## 2011-11-25 MED ORDER — IOHEXOL 300 MG/ML  SOLN
100.0000 mL | Freq: Once | INTRAMUSCULAR | Status: AC | PRN
  Administered 2011-11-25: 100 mL via INTRAVENOUS

## 2011-11-26 ENCOUNTER — Other Ambulatory Visit: Payer: Self-pay

## 2011-11-26 ENCOUNTER — Telehealth: Payer: Self-pay | Admitting: Gastroenterology

## 2011-11-26 DIAGNOSIS — K432 Incisional hernia without obstruction or gangrene: Secondary | ICD-10-CM

## 2011-11-26 DIAGNOSIS — N289 Disorder of kidney and ureter, unspecified: Secondary | ICD-10-CM

## 2011-11-26 NOTE — Telephone Encounter (Signed)
See CT scan dated 11/26/11 for information

## 2011-12-01 ENCOUNTER — Telehealth: Payer: Self-pay | Admitting: Gastroenterology

## 2011-12-01 DIAGNOSIS — N8111 Cystocele, midline: Secondary | ICD-10-CM | POA: Diagnosis not present

## 2011-12-01 NOTE — Telephone Encounter (Signed)
Pt Signed ROI, Picked up copy of Labs & CT  12/01/11/KM

## 2011-12-04 ENCOUNTER — Telehealth: Payer: Self-pay | Admitting: Gastroenterology

## 2011-12-04 NOTE — Telephone Encounter (Signed)
Pt called to report she was called a couple of weeks ago and was to be referred to [facility] for a kidney problem and she hasn't heard anything. Our notes state the info for faxed to [facility]. Called them and they an appt was never made.  Scheduled pt for an appt with Dr Margarita Grizzle for 12/17/11 at 1:45pm for a 2pm appt; faxed info to [phone]. Notified pt of appt and explained [facility] will mail her info; pt stated understanding. Pt also states she has an appt with Dr Jola Babinski at [facility].

## 2011-12-08 ENCOUNTER — Telehealth: Payer: Self-pay

## 2011-12-08 DIAGNOSIS — K432 Incisional hernia without obstruction or gangrene: Secondary | ICD-10-CM | POA: Diagnosis not present

## 2011-12-08 NOTE — Telephone Encounter (Deleted)
error 

## 2011-12-08 NOTE — Telephone Encounter (Signed)
Faxed CT to [facility] at fax number [phone]

## 2011-12-08 NOTE — Telephone Encounter (Signed)
error 

## 2011-12-17 DIAGNOSIS — N289 Disorder of kidney and ureter, unspecified: Secondary | ICD-10-CM | POA: Diagnosis not present

## 2011-12-18 ENCOUNTER — Other Ambulatory Visit (HOSPITAL_COMMUNITY): Payer: Self-pay | Admitting: Urology

## 2011-12-18 DIAGNOSIS — N2889 Other specified disorders of kidney and ureter: Secondary | ICD-10-CM

## 2011-12-22 ENCOUNTER — Other Ambulatory Visit (HOSPITAL_COMMUNITY): Payer: Self-pay | Admitting: Urology

## 2011-12-22 ENCOUNTER — Ambulatory Visit (HOSPITAL_COMMUNITY)
Admission: RE | Admit: 2011-12-22 | Discharge: 2011-12-22 | Disposition: A | Discharge status: Home / Self Care | Payer: Medicare Other | Source: Ambulatory Visit | Attending: Urology | Admitting: Urology

## 2011-12-22 DIAGNOSIS — N289 Disorder of kidney and ureter, unspecified: Secondary | ICD-10-CM | POA: Diagnosis not present

## 2011-12-22 DIAGNOSIS — D4959 Neoplasm of unspecified behavior of other genitourinary organ: Secondary | ICD-10-CM

## 2011-12-22 DIAGNOSIS — N281 Cyst of kidney, acquired: Secondary | ICD-10-CM | POA: Insufficient documentation

## 2011-12-22 DIAGNOSIS — Z90411 Acquired partial absence of pancreas: Secondary | ICD-10-CM | POA: Diagnosis not present

## 2011-12-22 DIAGNOSIS — K7689 Other specified diseases of liver: Secondary | ICD-10-CM | POA: Diagnosis not present

## 2011-12-22 DIAGNOSIS — R918 Other nonspecific abnormal finding of lung field: Secondary | ICD-10-CM | POA: Diagnosis not present

## 2011-12-22 DIAGNOSIS — N2889 Other specified disorders of kidney and ureter: Secondary | ICD-10-CM

## 2011-12-22 MED ORDER — GADOBENATE DIMEGLUMINE 529 MG/ML IV SOLN
13.0000 mL | Freq: Once | INTRAVENOUS | Status: AC | PRN
  Administered 2011-12-22: 13 mL via INTRAVENOUS

## 2012-01-06 DIAGNOSIS — K432 Incisional hernia without obstruction or gangrene: Secondary | ICD-10-CM | POA: Diagnosis not present

## 2012-01-15 DIAGNOSIS — Z0389 Encounter for observation for other suspected diseases and conditions ruled out: Secondary | ICD-10-CM | POA: Diagnosis not present

## 2012-01-15 DIAGNOSIS — Z79899 Other long term (current) drug therapy: Secondary | ICD-10-CM | POA: Diagnosis not present

## 2012-01-15 DIAGNOSIS — K432 Incisional hernia without obstruction or gangrene: Secondary | ICD-10-CM | POA: Diagnosis not present

## 2012-01-21 DIAGNOSIS — K432 Incisional hernia without obstruction or gangrene: Secondary | ICD-10-CM | POA: Diagnosis not present

## 2012-01-22 ENCOUNTER — Encounter (HOSPITAL_COMMUNITY): Payer: Self-pay | Admitting: Emergency Medicine

## 2012-01-22 ENCOUNTER — Inpatient Hospital Stay (HOSPITAL_COMMUNITY)
Admission: EM | Admit: 2012-01-22 | Discharge: 2012-01-25 | DRG: 812 | Disposition: A | Discharge status: Home / Self Care | Payer: Medicare Other | Attending: Internal Medicine | Admitting: Internal Medicine

## 2012-01-22 DIAGNOSIS — R109 Unspecified abdominal pain: Secondary | ICD-10-CM | POA: Diagnosis not present

## 2012-01-22 DIAGNOSIS — R404 Transient alteration of awareness: Secondary | ICD-10-CM | POA: Diagnosis not present

## 2012-01-22 DIAGNOSIS — E860 Dehydration: Secondary | ICD-10-CM | POA: Diagnosis not present

## 2012-01-22 DIAGNOSIS — D649 Anemia, unspecified: Secondary | ICD-10-CM

## 2012-01-22 DIAGNOSIS — R933 Abnormal findings on diagnostic imaging of other parts of digestive tract: Secondary | ICD-10-CM

## 2012-01-22 DIAGNOSIS — R55 Syncope and collapse: Secondary | ICD-10-CM | POA: Diagnosis present

## 2012-01-22 DIAGNOSIS — E871 Hypo-osmolality and hyponatremia: Secondary | ICD-10-CM | POA: Diagnosis not present

## 2012-01-22 DIAGNOSIS — R6889 Other general symptoms and signs: Secondary | ICD-10-CM | POA: Diagnosis not present

## 2012-01-22 DIAGNOSIS — R1013 Epigastric pain: Secondary | ICD-10-CM | POA: Diagnosis present

## 2012-01-22 DIAGNOSIS — R61 Generalized hyperhidrosis: Secondary | ICD-10-CM | POA: Diagnosis not present

## 2012-01-22 DIAGNOSIS — D62 Acute posthemorrhagic anemia: Principal | ICD-10-CM | POA: Diagnosis present

## 2012-01-22 DIAGNOSIS — Z9889 Other specified postprocedural states: Secondary | ICD-10-CM

## 2012-01-22 DIAGNOSIS — J9819 Other pulmonary collapse: Secondary | ICD-10-CM | POA: Diagnosis not present

## 2012-01-22 NOTE — ED Notes (Signed)
WUJ:[id number]<JY> Expected date:<BR> Expected time:11:25 PM<BR> Means of arrival:<BR> Comments:<BR> M10 - 75yoF poor po intake after hernia repair

## 2012-01-22 NOTE — ED Notes (Signed)
Patient complains of feeling faint, nausea and vomiting. Patient had left hernia repair surgery on yesterday. EMS also reports low blood pressure systolic in the 80's

## 2012-01-23 ENCOUNTER — Encounter (HOSPITAL_COMMUNITY): Payer: Self-pay | Admitting: Emergency Medicine

## 2012-01-23 ENCOUNTER — Emergency Department (HOSPITAL_COMMUNITY): Payer: Medicare Other

## 2012-01-23 DIAGNOSIS — E860 Dehydration: Secondary | ICD-10-CM | POA: Diagnosis not present

## 2012-01-23 DIAGNOSIS — E782 Mixed hyperlipidemia: Secondary | ICD-10-CM | POA: Diagnosis not present

## 2012-01-23 DIAGNOSIS — J9819 Other pulmonary collapse: Secondary | ICD-10-CM | POA: Diagnosis not present

## 2012-01-23 DIAGNOSIS — I10 Essential (primary) hypertension: Secondary | ICD-10-CM | POA: Diagnosis not present

## 2012-01-23 DIAGNOSIS — R1013 Epigastric pain: Secondary | ICD-10-CM | POA: Diagnosis not present

## 2012-01-23 DIAGNOSIS — D62 Acute posthemorrhagic anemia: Secondary | ICD-10-CM | POA: Diagnosis not present

## 2012-01-23 DIAGNOSIS — E871 Hypo-osmolality and hyponatremia: Secondary | ICD-10-CM | POA: Diagnosis not present

## 2012-01-23 DIAGNOSIS — Z9889 Other specified postprocedural states: Secondary | ICD-10-CM | POA: Diagnosis not present

## 2012-01-23 DIAGNOSIS — I369 Nonrheumatic tricuspid valve disorder, unspecified: Secondary | ICD-10-CM | POA: Diagnosis not present

## 2012-01-23 DIAGNOSIS — R55 Syncope and collapse: Secondary | ICD-10-CM | POA: Diagnosis not present

## 2012-01-23 DIAGNOSIS — R109 Unspecified abdominal pain: Secondary | ICD-10-CM | POA: Diagnosis not present

## 2012-01-23 LAB — TSH: TSH: 1.513 u[IU]/mL (ref 0.350–4.500)

## 2012-01-23 LAB — BASIC METABOLIC PANEL
BUN: 59 mg/dL — ABNORMAL HIGH (ref 6–23)
CO2: 24 mEq/L (ref 19–32)
Calcium: 9.1 mg/dL (ref 8.4–10.5)
Chloride: 97 mEq/L (ref 96–112)
Creatinine, Ser: 0.74 mg/dL (ref 0.50–1.10)
GFR calc Af Amer: >90 mL/min (ref >90)
GFR calc non Af Amer: 82 mL/min — ABNORMAL LOW (ref >90)
Glucose, Bld: 119 mg/dL — ABNORMAL HIGH (ref 70–99)
Potassium: 3.8 mEq/L (ref 3.5–5.1)
Sodium: 131 mEq/L — ABNORMAL LOW (ref 135–145)

## 2012-01-23 LAB — DIFFERENTIAL
Basophils Absolute: 0 10*3/uL (ref 0.0–0.1)
Basophils Relative: 0 % (ref 0–1)
Eosinophils Absolute: 0 10*3/uL (ref 0.0–0.7)
Eosinophils Relative: 0 % (ref 0–5)
Lymphocytes Relative: 15 % (ref 12–46)
Lymphs Abs: 1.5 10*3/uL (ref 0.7–4.0)
Monocytes Absolute: 0.3 10*3/uL (ref 0.1–1.0)
Monocytes Relative: 3 % (ref 3–12)
Neutro Abs: 8.1 10*3/uL — ABNORMAL HIGH (ref 1.7–7.7)
Neutrophils Relative %: 82 % — ABNORMAL HIGH (ref 43–77)

## 2012-01-23 LAB — CBC
HCT: 25.5 % — ABNORMAL LOW (ref 36.0–46.0)
Hemoglobin: 8.6 g/dL — ABNORMAL LOW (ref 12.0–15.0)
MCH: 30.6 pg (ref 26.0–34.0)
MCHC: 33.7 g/dL (ref 30.0–36.0)
MCV: 90.7 fL (ref 78.0–100.0)
Platelets: 209 10*3/uL (ref 150–400)
RBC: 2.81 MIL/uL — ABNORMAL LOW (ref 3.87–5.11)
RDW: 14.1 % (ref 11.5–15.5)
WBC: 9.8 10*3/uL (ref 4.0–10.5)

## 2012-01-23 LAB — MAGNESIUM: Magnesium: 1.6 mg/dL (ref 1.5–2.5)

## 2012-01-23 LAB — URINALYSIS, ROUTINE W REFLEX MICROSCOPIC
Bilirubin Urine: NEGATIVE
Glucose, UA: NEGATIVE mg/dL
Hgb urine dipstick: NEGATIVE
Ketones, ur: NEGATIVE mg/dL
Leukocytes, UA: NEGATIVE
Nitrite: NEGATIVE
Protein, ur: NEGATIVE mg/dL
Specific Gravity, Urine: 1.02 (ref 1.005–1.030)
Urobilinogen, UA: 0.2 mg/dL (ref 0.0–1.0)
pH: 6 (ref 5.0–8.0)

## 2012-01-23 LAB — CARDIAC PANEL(CRET KIN+CKTOT+MB+TROPI)
CK, MB: 2.1 ng/mL (ref 0.3–4.0)
Relative Index: INVALID (ref 0.0–2.5)
Total CK: 81 U/L (ref 7–177)
Troponin I: <0.3 ng/mL (ref <0.30)

## 2012-01-23 LAB — ABO/RH: ABO/RH(D): O POS

## 2012-01-23 LAB — PRO B NATRIURETIC PEPTIDE: Pro B Natriuretic peptide (BNP): 69.6 pg/mL (ref 0–125)

## 2012-01-23 LAB — HEMOGLOBIN AND HEMATOCRIT, BLOOD
HCT: 20.7 % — ABNORMAL LOW (ref 36.0–46.0)
Hemoglobin: 7 g/dL — ABNORMAL LOW (ref 12.0–15.0)

## 2012-01-23 LAB — PREPARE RBC (CROSSMATCH)

## 2012-01-23 LAB — HEMOGLOBIN A1C
Hgb A1c MFr Bld: 5.7 % — ABNORMAL HIGH (ref <5.7)
Mean Plasma Glucose: 117 mg/dL — ABNORMAL HIGH (ref <117)

## 2012-01-23 LAB — OCCULT BLOOD, POC DEVICE: Fecal Occult Bld: NEGATIVE

## 2012-01-23 LAB — PHOSPHORUS: Phosphorus: 1.6 mg/dL — ABNORMAL LOW (ref 2.3–4.6)

## 2012-01-23 MED ORDER — ONDANSETRON HCL 4 MG PO TABS: 4.0000 mg | ORAL_TABLET | Freq: Four times a day (QID) | ORAL | Status: DC | PRN

## 2012-01-23 MED ORDER — ACETAMINOPHEN 325 MG PO TABS
650.0000 mg | ORAL_TABLET | Freq: Four times a day (QID) | ORAL | Status: DC | PRN
  Administered 2012-01-23 – 2012-01-25 (×3): 650 mg via ORAL
  Filled 2012-01-23 (×3): qty 2

## 2012-01-23 MED ORDER — FENTANYL CITRATE 0.05 MG/ML IJ SOLN
25.0000 ug | Freq: Once | INTRAMUSCULAR | Status: AC
  Administered 2012-01-23: 25 ug via INTRAVENOUS
  Filled 2012-01-23: qty 2

## 2012-01-23 MED ORDER — PHENOL 1.4 % MT LIQD
1.0000 | OROMUCOSAL | Status: DC | PRN
  Filled 2012-01-23: qty 177

## 2012-01-23 MED ORDER — ACETAMINOPHEN 325 MG PO TABS: 650.0000 mg | ORAL_TABLET | ORAL | Status: DC | PRN

## 2012-01-23 MED ORDER — HYDROCHLOROTHIAZIDE 25 MG PO TABS
25.0000 mg | ORAL_TABLET | Freq: Every day | ORAL | Status: DC
  Administered 2012-01-23 – 2012-01-25 (×3): 25 mg via ORAL
  Filled 2012-01-23 (×3): qty 1

## 2012-01-23 MED ORDER — SODIUM CHLORIDE 0.9 % IV SOLN: INTRAVENOUS | Status: AC

## 2012-01-23 MED ORDER — ONDANSETRON HCL 4 MG/2ML IJ SOLN
4.0000 mg | Freq: Once | INTRAMUSCULAR | Status: AC
  Administered 2012-01-23: 4 mg via INTRAVENOUS
  Filled 2012-01-23: qty 2

## 2012-01-23 MED ORDER — LIDOCAINE 5 % EX PTCH
1.0000 | MEDICATED_PATCH | CUTANEOUS | Status: DC
  Administered 2012-01-24 – 2012-01-25 (×2): 1 via TRANSDERMAL
  Filled 2012-01-23 (×3): qty 1

## 2012-01-23 MED ORDER — BISACODYL 5 MG PO TBEC
10.0000 mg | DELAYED_RELEASE_TABLET | Freq: Once | ORAL | Status: AC
  Administered 2012-01-23: 10 mg via ORAL
  Filled 2012-01-23: qty 2

## 2012-01-23 MED ORDER — K PHOS MONO-SOD PHOS DI & MONO 155-852-130 MG PO TABS
250.0000 mg | ORAL_TABLET | Freq: Four times a day (QID) | ORAL | Status: DC
  Administered 2012-01-24 (×3): 250 mg via ORAL
  Filled 2012-01-23 (×8): qty 1

## 2012-01-23 MED ORDER — ENOXAPARIN SODIUM 30 MG/0.3ML ~~LOC~~ SOLN
30.0000 mg | SUBCUTANEOUS | Status: DC
  Filled 2012-01-23: qty 0.3

## 2012-01-23 MED ORDER — ASPIRIN EC 81 MG PO TBEC
81.0000 mg | DELAYED_RELEASE_TABLET | Freq: Every day | ORAL | Status: DC
  Filled 2012-01-23: qty 1

## 2012-01-23 MED ORDER — OXYCODONE HCL 5 MG PO TABS
5.0000 mg | ORAL_TABLET | ORAL | Status: DC | PRN
  Filled 2012-01-23: qty 1

## 2012-01-23 MED ORDER — LUBIPROSTONE 24 MCG PO CAPS
24.0000 ug | ORAL_CAPSULE | Freq: Every day | ORAL | Status: DC
  Administered 2012-01-24 – 2012-01-25 (×2): 24 ug via ORAL
  Filled 2012-01-23 (×2): qty 1

## 2012-01-23 MED ORDER — ALPRAZOLAM 0.5 MG PO TABS
0.5000 mg | ORAL_TABLET | Freq: Every evening | ORAL | Status: DC | PRN
  Administered 2012-01-23 – 2012-01-24 (×2): 0.5 mg via ORAL
  Filled 2012-01-23 (×2): qty 1

## 2012-01-23 MED ORDER — ESTRADIOL 1 MG PO TABS
1.0000 mg | ORAL_TABLET | Freq: Every day | ORAL | Status: DC
  Administered 2012-01-25: 1 mg via ORAL
  Filled 2012-01-23 (×3): qty 1

## 2012-01-23 MED ORDER — METOPROLOL TARTRATE 100 MG PO TABS
100.0000 mg | ORAL_TABLET | Freq: Two times a day (BID) | ORAL | Status: DC
  Administered 2012-01-23 – 2012-01-25 (×4): 100 mg via ORAL
  Filled 2012-01-23 (×6): qty 1

## 2012-01-23 MED ORDER — SODIUM CHLORIDE 0.9 % IV SOLN
Freq: Once | INTRAVENOUS | Status: AC
  Administered 2012-01-23: 08:00:00 via INTRAVENOUS

## 2012-01-23 MED ORDER — SODIUM CHLORIDE 0.9 % IV BOLUS (SEPSIS)
500.0000 mL | Freq: Once | INTRAVENOUS | Status: AC
  Administered 2012-01-23: 500 mL via INTRAVENOUS

## 2012-01-23 MED ORDER — ONDANSETRON HCL 4 MG/2ML IJ SOLN: 4.0000 mg | Freq: Four times a day (QID) | INTRAMUSCULAR | Status: DC | PRN

## 2012-01-23 MED ORDER — SODIUM CHLORIDE 0.9 % IJ SOLN
3.0000 mL | Freq: Two times a day (BID) | INTRAMUSCULAR | Status: DC
  Administered 2012-01-24 – 2012-01-25 (×3): 3 mL via INTRAVENOUS

## 2012-01-23 MED ORDER — SODIUM CHLORIDE 0.9 % IV BOLUS (SEPSIS)
1000.0000 mL | Freq: Once | INTRAVENOUS | Status: AC
  Administered 2012-01-23: 1000 mL via INTRAVENOUS

## 2012-01-23 MED ORDER — FENTANYL CITRATE 0.05 MG/ML IJ SOLN: 50.0000 ug | Freq: Once | INTRAMUSCULAR | Status: DC

## 2012-01-23 MED ORDER — LOSARTAN POTASSIUM 50 MG PO TABS
50.0000 mg | ORAL_TABLET | Freq: Every day | ORAL | Status: DC
  Administered 2012-01-23 – 2012-01-25 (×3): 50 mg via ORAL
  Filled 2012-01-23 (×3): qty 1

## 2012-01-23 NOTE — ED Provider Notes (Addendum)
History     CSN: [id number]  Arrival date & time 01/22/12  2355   First MD Initiated Contact with Patient 01/23/12 0144      Chief Complaint  Patient presents with  . Emesis  . Near Syncope    (Consider location/radiation/quality/duration/timing/severity/associated sxs/prior treatment) HPI Comments: Patient had a prior Whipple surgery in 2011 at [facility], and yesterday had surgery laparoscopically for hernia repair. This was done as an outpatient and the patient has been at home resting. She was taking Percocet for pain and apparently developed some associated nausea. In addition today she has been having episodes of feeling very lightheaded and weak and feeling almost faint as well as episodes of some chills and sweats. She denies chest pain. She reports there is some pain in the epigastric region with coughing. She does have some upper abdominal discomfort which she attributes to her surgery. She denies any new worsening abdominal pain. She denies any documented fever. She has had a slight dry cough since yesterday. She reports prior to the surgery she had been feeling well. She does take medication for high blood pressure but denies any other significant past medical history. She reports that she had the Whipple procedure do to a large pancreatic cyst. She also knows that she has a cyst on one of her kidneys.  Patient is a 76 y.o. female presenting with vomiting. The history is provided by the patient and a relative.  Emesis  Associated symptoms include abdominal pain, chills and cough. Pertinent negatives include no diarrhea and no headaches.    Past Medical History  Diagnosis Date  . Pancreatic cyst     Past Surgical History  Procedure Date  . Stomach surgery   . Hernia repair     left hernia    Family History  Problem Relation Age of Onset  . Breast cancer Sister   . Stroke Mother   . Colon cancer Brother     History  Substance Use Topics  . Smoking status: Never Smoker    . Smokeless tobacco: Never Used  . Alcohol Use: No    OB History    Grav Para Term Preterm Abortions TAB SAB Ect Mult Living                  Review of Systems  Constitutional: Positive for chills.  HENT: Negative for congestion, rhinorrhea and postnasal drip.   Respiratory: Positive for cough. Negative for shortness of breath.   Gastrointestinal: Positive for nausea, vomiting and abdominal pain. Negative for diarrhea.  Musculoskeletal: Negative for back pain.  Neurological: Positive for light-headedness. Negative for headaches.    Allergies  WUJ:[id number]+AOZHYQMVH+QIONGEXBMW acid+aspartame; Morphine; Prednisone; and Sulfonamide derivatives  Home Medications   Current Outpatient Rx  Name Route Sig Dispense Refill  . ALPRAZOLAM 0.5 MG PO TABS Oral Take 0.5 mg by mouth at bedtime as needed.    . ASPIRIN 81 MG PO TABS Oral Take 81 mg by mouth daily.    Marland Kitchen CALCIUM CITRATE-VITAMIN D 315-200 MG-UNIT PO TABS Oral Take 1 tablet by mouth 2 (two) times daily.    Marland Kitchen VITAMIN D 1000 UNITS PO TABS Oral Take 1,000 Units by mouth daily.    Marland Kitchen ESTRADIOL 1 MG PO TABS Vaginal Place 1 mg vaginally daily.     Marland Kitchen HYDROCHLOROTHIAZIDE 25 MG PO TABS Oral Take 25 mg by mouth daily.    Marland Kitchen LIDOCAINE 5 % EX PTCH Transdermal Place 1 patch onto the skin daily. Remove & Discard patch within  12 hours or as directed by MD    . Claris Gladden POTASSIUM 50 MG PO TABS Oral Take 50 mg by mouth daily.    . LUBIPROSTONE 24 MCG PO CAPS Oral Take 24 mcg by mouth daily with breakfast.    . METOPROLOL TARTRATE 100 MG PO TABS Oral Take 100 mg by mouth 2 (two) times daily.    . OXYCODONE HCL 5 MG PO TABS Oral Take 5-10 mg by mouth every 4 (four) hours as needed. Post surgery pain      BP 125/47  Pulse 75  Temp(Src) 98.5 F (36.9 C) (Oral)  Resp 17  SpO2 93%  Physical Exam  Nursing note and vitals reviewed. Constitutional: She appears well-developed and well-nourished.  HENT:  Head: Normocephalic.  Pulmonary/Chest:  Effort normal and breath sounds normal. No respiratory distress. She has no wheezes.  Abdominal: Soft. Normal appearance. She exhibits no distension and no fluid wave. Bowel sounds are increased. There is no rebound, no guarding and no CVA tenderness.       Some bruising noted around laporascopic entry sites  Neurological: She is alert. No cranial nerve deficit.  Psychiatric: Her mood appears not anxious. Her speech is not rapid and/or pressured.    ED Course  Procedures (including critical care time)  Labs Reviewed  CBC - Abnormal; Notable for the following:    RBC 2.81 (*)    Hemoglobin 8.6 (*)    HCT 25.5 (*)    All other components within normal limits  DIFFERENTIAL - Abnormal; Notable for the following:    Neutrophils Relative 82 (*)    Neutro Abs 8.1 (*)    All other components within normal limits  BASIC METABOLIC PANEL - Abnormal; Notable for the following:    Sodium 131 (*)    Glucose, Bld 119 (*)    BUN 59 (*)    GFR calc non Af Amer 82 (*)    All other components within normal limits   No results found.   1. Dehydration   2. Hyponatremia   3. Anemia     EKG at time 03:01, shows normal sinus rhythm at a rate of 72. Normal axis, normal intervals. No ST or T wave abnormalities noted.    4:24 AM Pt's Hgb is low at 8.6, will try to get pre-op Hgb from [facility].  Her last prior from here was 12.  Also BUN is quite elevated suggesting dehydration as well which indicates that the 8.6 Hgb is actually concentrated.  This is likely explanation for symptoms.  I recommend to pt to be admitted.  Could be transfer to [facility] or to remain here for IVF's and monitoring, possibly blood transfusion.     6:25 AM Pt feels somewhat improved, however with standing, her BP dropped again and she felt weak and light headed.  Given choices, she prefers to stay and be admitted here.  Abd again is soft, no guard or rebound.  Will order type and screen anticipating a blood transfusion.  Will also  order another H&H.    7:04 AM [facility] will see and admit.    7:17 AM Pt's hemoccult performed by me, normal tone, stool is brown in color, tested hem neg.  Chaperone was present in room.    MDM  Pt with appropriately tender abd, no rebound, + BS, unlikely obstructed, may be simply dehydrated as family notes pt had decreased urine output at home.  Slight cough, will get CXR.  Also check labs, give IVF's and  zofran and continue to monitor.  No ischemia on ECG.            Gavin Pound. Oletta Lamas, MD 01/23/12 1610  Gavin Pound. Oletta Lamas, MD 01/23/12 [phone]

## 2012-01-23 NOTE — Progress Notes (Signed)
Occupational Therapy Note Order noted, E.D notes reviewed, and see pt is receiving blood. Hgb 7.0. Will check back on pt at a later time. Judithann Sauger OTR/L [phone] 01/23/2012

## 2012-01-23 NOTE — H&P (Signed)
PCP:  Mickie Hillier, MD, MD   DOA:  01/22/2012 11:56 PM  Chief Complaint:  Syncopal episode  HPI: Patient had a prior Whipple surgery in 2011 at [facility], and yesterday had surgery laparoscopically for hernia repair. This was done as an outpatient and the patient has been at home resting. She was taking Percocet for pain and apparently developed some associated nausea. She has been having episodes of feeling very lightheaded and weak and feeling almost faint as well as episodes of some chills and sweats. She denies chest pain. She reports there is some pain in the epigastric region with coughing. She does have some upper abdominal discomfort which she attributes to her surgery. She denies any new worsening abdominal pain. She denies any documented fever. She has had a slight dry cough since yesterday. She reports prior to the surgery she had been feeling well. She does take medication for high blood pressure but denies any other significant past medical history. She reports that she had the Whipple procedure do to a large pancreatic cyst. She also knows that she has a cyst on one of her kidneys.   Allergies: Allergies  Allergen Reactions  . YQM:VHQIONGEXBM+WUXLKGMWN+UUVOZDGUYQ Acid+Aspartame   . Morphine   . Prednisone   . Sulfonamide Derivatives     Prior to Admission medications   Medication Sig Start Date End Date Taking? Authorizing Provider  ALPRAZolam Prudy Feeler) 0.5 MG tablet Take 0.5 mg by mouth at bedtime as needed.   Yes Historical Provider, MD  aspirin 81 MG tablet Take 81 mg by mouth daily.   Yes Historical Provider, MD  calcium citrate-vitamin D (CITRACAL+D) 315-200 MG-UNIT per tablet Take 1 tablet by mouth 2 (two) times daily.   Yes Historical Provider, MD  cholecalciferol (VITAMIN D) 1000 UNITS tablet Take 1,000 Units by mouth daily.   Yes Historical Provider, MD  estradiol (ESTRACE) 1 MG tablet Place 1 mg vaginally daily.    Yes Historical Provider, MD  hydrochlorothiazide  (HYDRODIURIL) 25 MG tablet Take 25 mg by mouth daily.   Yes Historical Provider, MD  lidocaine (LIDODERM) 5 % Place 1 patch onto the skin daily. Remove & Discard patch within 12 hours or as directed by MD   Yes Historical Provider, MD  losartan (COZAAR) 50 MG tablet Take 50 mg by mouth daily.   Yes Historical Provider, MD  lubiprostone (AMITIZA) 24 MCG capsule Take 24 mcg by mouth daily with breakfast.   Yes Historical Provider, MD  metoprolol (LOPRESSOR) 100 MG tablet Take 100 mg by mouth 2 (two) times daily.   Yes Historical Provider, MD  oxyCODONE (OXY IR/ROXICODONE) 5 MG immediate release tablet Take 5-10 mg by mouth every 4 (four) hours as needed. Post surgery pain   Yes Historical Provider, MD    Past Medical History  Diagnosis Date  . Pancreatic cyst     Past Surgical History  Procedure Date  . Stomach surgery   . Hernia repair     left hernia    Social History:  reports that she has never smoked. She has never used smokeless tobacco. She reports that she does not drink alcohol or use illicit drugs.  Family History  Problem Relation Age of Onset  . Breast cancer Sister   . Stroke Mother   . Colon cancer Brother     Review of Systems:  Per HPI   Physical Exam:  Filed Vitals:   01/23/12 0645 01/23/12 0700 01/23/12 0715 01/23/12 0738  BP: 131/50 113/51 115/49 128/56  Pulse:  80 71 85 79  Temp:      TempSrc:      Resp: 18 16 25 19   SpO2: 99% 100% 100% 100%    Constitutional: Vital signs reviewed.  Patient is in no acute distress and cooperative with exam. Alert and oriented x3.  Head: Normocephalic and atraumatic Ear: TM normal bilaterally Mouth: no erythema or exudates, MMM Eyes: PERRL, EOMI, conjunctivae normal, No scleral icterus.  Neck: Supple, Trachea midline normal ROM, No JVD, mass, thyromegaly, or carotid bruit present.  Cardiovascular: RRR, S1 normal, S2 normal, no MRG, pulses symmetric and intact bilaterally Pulmonary/Chest: CTAB, no wheezes, rales, or  rhonchi Abdominal: Soft. Non-tender, non-distended, bowel sounds are normal, no masses, organomegaly, or guarding present.  GU: no CVA tenderness Musculoskeletal: No joint deformities, erythema, or stiffness, ROM full and no nontender Ext: no edema and no cyanosis, pulses palpable bilaterally (DP and PT) Hematology: no cervical, inginal, or axillary adenopathy.  Neurological: A&O x3, Strenght is normal and symmetric bilaterally, cranial nerve II-XII are grossly intact, no focal motor deficit, sensory intact to light touch bilaterally.  Skin: Warm, dry and intact. No rash, cyanosis, or clubbing.  Psychiatric: Normal mood and affect. speech and behavior is normal. Judgment and thought content normal. Cognition and memory are normal.   Labs on Admission:  Results for orders placed during the hospital encounter of 01/22/12 (from the past 48 hour(s))  CBC     Status: Abnormal   Collection Time   01/23/12  2:01 AM      Component Value Range Comment   WBC 9.8  4.0 - 10.5 (K/uL)    RBC 2.81 (*) 3.87 - 5.11 (MIL/uL)    Hemoglobin 8.6 (*) 12.0 - 15.0 (g/dL)    HCT 16.1 (*) 09.6 - 46.0 (%)    MCV 90.7  78.0 - 100.0 (fL)    MCH 30.6  26.0 - 34.0 (pg)    MCHC 33.7  30.0 - 36.0 (g/dL)    RDW 04.5  40.9 - 81.1 (%)    Platelets 209  150 - 400 (K/uL)   DIFFERENTIAL     Status: Abnormal   Collection Time   01/23/12  2:01 AM      Component Value Range Comment   Neutrophils Relative 82 (*) 43 - 77 (%)    Neutro Abs 8.1 (*) 1.7 - 7.7 (K/uL)    Lymphocytes Relative 15  12 - 46 (%)    Lymphs Abs 1.5  0.7 - 4.0 (K/uL)    Monocytes Relative 3  3 - 12 (%)    Monocytes Absolute 0.3  0.1 - 1.0 (K/uL)    Eosinophils Relative 0  0 - 5 (%)    Eosinophils Absolute 0.0  0.0 - 0.7 (K/uL)    Basophils Relative 0  0 - 1 (%)    Basophils Absolute 0.0  0.0 - 0.1 (K/uL)   BASIC METABOLIC PANEL     Status: Abnormal   Collection Time   01/23/12  2:01 AM      Component Value Range Comment   Sodium 131 (*) 135 - 145  (mEq/L)    Potassium 3.8  3.5 - 5.1 (mEq/L)    Chloride 97  96 - 112 (mEq/L)    CO2 24  19 - 32 (mEq/L)    Glucose, Bld 119 (*) 70 - 99 (mg/dL)    BUN 59 (*) 6 - 23 (mg/dL)    Creatinine, Ser 9.14  0.50 - 1.10 (mg/dL)    Calcium 9.1  8.4 - 10.5 (mg/dL)    GFR calc non Af Amer 82 (*) >90 (mL/min)    GFR calc Af Amer >90  >90 (mL/min)   TYPE AND SCREEN     Status: Normal   Collection Time   01/23/12  6:35 AM      Component Value Range Comment   ABO/RH(D) O POS      Antibody Screen NEG      Sample Expiration 01/26/2012     HEMOGLOBIN AND HEMATOCRIT, BLOOD     Status: Abnormal   Collection Time   01/23/12  6:35 AM      Component Value Range Comment   Hemoglobin 7.0 (*) 12.0 - 15.0 (g/dL)    HCT 13.0 (*) 86.5 - 46.0 (%)   OCCULT BLOOD, POC DEVICE     Status: Normal   Collection Time   01/23/12  7:19 AM      Component Value Range Comment   Fecal Occult Bld NEGATIVE       Radiological Exams on Admission: No results found.  Assessment/Plan  Syncope  - unclear etiology - will admit the pt to telemetry unit for further evaluation and management - will obtain CE's, TSH, CBC, BMP - PT evaluation - will provide supportive care with IVF, analgesia for pain control if needed  Anemia - Hg/ drop noted - CBC in AM  DVT Prophylaxis - SCD's  Code Status - Full  Education  - test results and diagnostic studies were discussed with patient and pt's family who was present at the bedside - patient and family have verbalized the understanding - questions were answered at the bedside and contact information was provided for additional questions or concerns  Time Spent on Admission: Over 30 minutes  MAGICK-Juanmiguel Defelice 01/23/2012, 8:27 AM  [facility] Pager # [phone] Main Office # [phone]

## 2012-01-23 NOTE — ED Notes (Signed)
Fentanyl 50 mcg cancelled ordered on wrong patient

## 2012-01-24 ENCOUNTER — Encounter (HOSPITAL_COMMUNITY): Payer: Self-pay | Admitting: *Deleted

## 2012-01-24 DIAGNOSIS — I369 Nonrheumatic tricuspid valve disorder, unspecified: Secondary | ICD-10-CM

## 2012-01-24 LAB — BASIC METABOLIC PANEL
BUN: 19 mg/dL (ref 6–23)
CO2: 26 mEq/L (ref 19–32)
Calcium: 8.9 mg/dL (ref 8.4–10.5)
Chloride: 101 mEq/L (ref 96–112)
Creatinine, Ser: 0.68 mg/dL (ref 0.50–1.10)
GFR calc Af Amer: >90 mL/min (ref >90)
GFR calc non Af Amer: 85 mL/min — ABNORMAL LOW (ref >90)
Glucose, Bld: 96 mg/dL (ref 70–99)
Potassium: 3.3 mEq/L — ABNORMAL LOW (ref 3.5–5.1)
Sodium: 135 mEq/L (ref 135–145)

## 2012-01-24 LAB — TYPE AND SCREEN
ABO/RH(D): O POS
Antibody Screen: NEGATIVE
Unit division: 0
Unit division: 0

## 2012-01-24 LAB — CARDIAC PANEL(CRET KIN+CKTOT+MB+TROPI)
CK, MB: 2.3 ng/mL (ref 0.3–4.0)
CK, MB: 2.2 ng/mL (ref 0.3–4.0)
Relative Index: INVALID (ref 0.0–2.5)
Relative Index: INVALID (ref 0.0–2.5)
Total CK: 66 U/L (ref 7–177)
Total CK: 61 U/L (ref 7–177)
Troponin I: <0.3 ng/mL (ref <0.30)
Troponin I: <0.3 ng/mL (ref <0.30)

## 2012-01-24 LAB — CBC
HCT: 28.6 % — ABNORMAL LOW (ref 36.0–46.0)
Hemoglobin: 9.8 g/dL — ABNORMAL LOW (ref 12.0–15.0)
MCH: 29.9 pg (ref 26.0–34.0)
MCHC: 34.3 g/dL (ref 30.0–36.0)
MCV: 87.2 fL (ref 78.0–100.0)
Platelets: 157 10*3/uL (ref 150–400)
RBC: 3.28 MIL/uL — ABNORMAL LOW (ref 3.87–5.11)
RDW: 16 % — ABNORMAL HIGH (ref 11.5–15.5)
WBC: 8.6 10*3/uL (ref 4.0–10.5)

## 2012-01-24 LAB — GLUCOSE, CAPILLARY: Glucose-Capillary: 94 mg/dL (ref 70–99)

## 2012-01-24 MED ORDER — K PHOS MONO-SOD PHOS DI & MONO 155-852-130 MG PO TABS
250.0000 mg | ORAL_TABLET | Freq: Four times a day (QID) | ORAL | Status: DC
  Administered 2012-01-24 – 2012-01-25 (×4): 250 mg via ORAL
  Filled 2012-01-24 (×6): qty 1

## 2012-01-24 NOTE — Progress Notes (Signed)
*  PRELIMINARY RESULTS* Echocardiogram 2D Echocardiogram has been performed.  Katheren Puller 01/24/2012, 8:36 AM

## 2012-01-24 NOTE — Plan of Care (Signed)
Problem: Phase II Progression Outcomes Goal: Discharge plan established Outcome: Progressing Home with spouse

## 2012-01-24 NOTE — Evaluation (Signed)
Physical Therapy Evaluation Patient Details Name: PINKEY MCJUNKIN MRN: [id number] DOB: 1935-12-07 Today's Date: 01/24/2012 Time: 4098-1191 PT Time Calculation (min): 15 min  PT Assessment / Plan / Recommendation Clinical Impression  76 y.o. female admitted with nausea, chills, lightheadedness, and anemia following hernia surgery. Pt now independent with mobility, no deficits noted. No further PT indicated. OK to DC home from PT standpoint.    PT Assessment  Patent does not need any further PT services    Follow Up Recommendations  No PT follow up    Equipment Recommendations  None recommended by PT    Frequency      Precautions / Restrictions Precautions Precautions: None Restrictions Weight Bearing Restrictions: No   Pertinent Vitals/Pain *5/10 pain at surgical site, L lower abdomen, RN notified, abdomen braced with pillow while walking**      Mobility  Bed Mobility Bed Mobility: Supine to Sit Supine to Sit: 6: Modified independent (Device/Increase time);With rails;HOB flat Transfers Transfers: Sit to Stand;Stand to Sit Sit to Stand: 7: Independent;From bed Stand to Sit: 7: Independent;To chair/3-in-1 Ambulation/Gait Ambulation/Gait Assistance: 7: Independent Ambulation Distance (Feet): 200 Feet Assistive device: None Ambulation/Gait Assistance Details: no LOB, no deviations Gait Pattern: Within Functional Limits Stairs: No Wheelchair Mobility Wheelchair Mobility: No    Exercises     PT Goals    Visit Information  Last PT Received On: 01/24/12 Assistance Needed: +1    Subjective Data      Prior Functioning  Home Living Lives With: Spouse Available Help at Discharge: Family Type of Home: House Home Access: Stairs to enter Home Layout: One level Prior Function Level of Independence: Independent Able to Take Stairs?: Yes Communication Communication: No difficulties    Cognition  Overall Cognitive Status: Appears within functional limits for  tasks assessed/performed Arousal/Alertness: Awake/alert Orientation Level: Appears intact for tasks assessed Behavior During Session: [facility] for tasks performed    Extremity/Trunk Assessment Right Upper Extremity Assessment RUE ROM/Strength/Tone: WFL for tasks assessed RUE Coordination: WFL - gross/fine motor Left Upper Extremity Assessment LUE ROM/Strength/Tone: WFL for tasks assessed LUE Coordination: WFL - gross/fine motor Right Lower Extremity Assessment RLE ROM/Strength/Tone: Within functional levels RLE Sensation: WFL - Light Touch RLE Coordination: WFL - gross/fine motor Left Lower Extremity Assessment LLE ROM/Strength/Tone: Within functional levels LLE Sensation: WFL - Light Touch LLE Coordination: WFL - gross/fine motor Trunk Assessment Trunk Assessment: Normal   Balance Balance Balance Assessed: Yes Static Sitting Balance Static Sitting - Balance Support: Bilateral upper extremity supported;Feet supported Static Sitting - Comment/# of Minutes: 3  End of Session PT - End of Session Activity Tolerance: Patient tolerated treatment well Patient left: in chair;with call bell/phone within reach Nurse Communication: Mobility status   Tamala Ser 01/24/2012, 11:43 AM [phone]

## 2012-01-24 NOTE — Progress Notes (Signed)
Patient ID: AKIRE RENNERT, female   DOB: January 11, 1936, 76 y.o.   MRN: [id number]  Subjective: No events overnight. Patient denies chest pain, shortness of breath, abdominal pain. Had bowel movement and reports ambulating.  Objective:  Vital signs in last 24 hours:  Filed Vitals:   01/23/12 2205 01/24/12 0500 01/24/12 0510 01/24/12 1429  BP: 126/61  132/74 123/58  Pulse: 93  76 82  Temp: 99.2 F (37.3 C)  98.5 F (36.9 C) 99.1 F (37.3 C)  TempSrc: Oral  Oral Oral  Resp: 16  18 18   Height:  5\' 4"  (1.626 m)    Weight:  64.864 kg (143 lb)    SpO2: 93%  94% 100%    Intake/Output from previous day:   Intake/Output Summary (Last 24 hours) at 01/24/12 1723 Last data filed at 01/23/12 2205  Gross per 24 hour  Intake 325.83 ml  Output      0 ml  Net 325.83 ml    Physical Exam: General: Alert, awake, oriented x3, in no acute distress. HEENT: No bruits, no goiter. Moist mucous membranes, no scleral icterus, no conjunctival pallor. Heart: Regular rate and rhythm, S1/S2 +, no murmurs, rubs, gallops. Lungs: Clear to auscultation bilaterally. No wheezing, no rhonchi, no rales.  Abdomen: Soft, nontender, nondistended, positive bowel sounds. Extremities: No clubbing or cyanosis, no pitting edema,  positive pedal pulses. Neuro: Grossly nonfocal.  Lab Results:  Basic Metabolic Panel:    Component Value Date/Time   NA 135 01/24/2012 0732   K 3.3* 01/24/2012 0732   CL 101 01/24/2012 0732   CO2 26 01/24/2012 0732   BUN 19 01/24/2012 0732   CREATININE 0.68 01/24/2012 0732   GLUCOSE 96 01/24/2012 0732   CALCIUM 8.9 01/24/2012 0732   CBC:    Component Value Date/Time   WBC 8.6 01/24/2012 0732   HGB 9.8* 01/24/2012 0732   HCT 28.6* 01/24/2012 0732   PLT 157 01/24/2012 0732   MCV 87.2 01/24/2012 0732   NEUTROABS 8.1* 01/23/2012 0201   LYMPHSABS 1.5 01/23/2012 0201   MONOABS 0.3 01/23/2012 0201   EOSABS 0.0 01/23/2012 0201   BASOSABS 0.0 01/23/2012 0201      Lab 01/24/12 0732 01/23/12  0635 01/23/12 0201  WBC 8.6 -- 9.8  HGB 9.8* 7.0* 8.6*  HCT 28.6* 20.7* 25.5*  PLT 157 -- 209  MCV 87.2 -- 90.7  MCH 29.9 -- 30.6  MCHC 34.3 -- 33.7  RDW 16.0* -- 14.1  LYMPHSABS -- -- 1.5  MONOABS -- -- 0.3  EOSABS -- -- 0.0  BASOSABS -- -- 0.0  BANDABS -- -- --    Lab 01/24/12 0732 01/23/12 0850 01/23/12 0201  NA 135 -- 131*  K 3.3* -- 3.8  CL 101 -- 97  CO2 26 -- 24  GLUCOSE 96 -- 119*  BUN 19 -- 59*  CREATININE 0.68 -- 0.74  CALCIUM 8.9 -- 9.1  MG -- 1.6 --   No results found for this basename: INR:5,PROTIME:5 in the last 168 hours Cardiac markers:  Lab 01/24/12 0732 01/23/12 2325 01/23/12 0850  CKMB 2.2 2.3 2.1  TROPONINI <0.30 <0.30 <0.30  MYOGLOBIN -- -- --   No components found with this basename: POCBNP:3 No results found for this or any previous visit (from the past 240 hour(s)).  Studies/Results: Dg Abd Acute W/chest  01/23/2012  *RADIOLOGY REPORT*  Clinical Data: Hernia repair 1 day ago.  Pain at the surgical site. Nausea and vomiting.  ACUTE ABDOMEN SERIES (ABDOMEN 2 VIEW &  CHEST 1 VIEW)  Comparison: 10/24/2010  Findings: Shallow inspiration.  Normal heart size and pulmonary vascularity.  Linear atelectasis in the left lung base.  No blunting of costophrenic angles.  No pneumothorax.  Surgical clips in the upper abdomen.  Scattered gas and stool in the colon.  No small or large bowel distension.  No evidence of free intra-abdominal air.  No abnormal air fluid levels. Suggestion of biliary gas in the right upper quadrant, likely due to previous sphincterotomy.  This is stable since the previous study. Right mid abdominal calcifications are stable since the previous study.  IMPRESSION: Linear atelectasis in the left lung base.  Nonobstructive bowel gas pattern. Stable biliary gas, likely postoperative.  Original Report Authenticated By: Marlon Pel, M.D.    Medications: Scheduled Meds:   . bisacodyl  10 mg Oral Once  . estradiol  1 mg Oral Daily  .  hydrochlorothiazide  25 mg Oral Daily  . lidocaine  1 patch Transdermal Q24H  . losartan  50 mg Oral Daily  . lubiprostone  24 mcg Oral Q breakfast  . metoprolol  100 mg Oral BID  . phosphorus  250 mg Oral QID  . sodium chloride  3 mL Intravenous Q12H   Continuous Infusions:   . sodium chloride     PRN Meds:.acetaminophen, ALPRAZolam, ondansetron (ZOFRAN) IV, ondansetron, oxyCODONE, phenol, DISCONTD: acetaminophen  Assessment/Plan:  Syncope  - unclear etiology, pt is doing well clinically and denies any dizziness or syncopal events - no events on telemetry overnight - cardiac enzymes so far are negative and not suggestive of cardiac etiology - PT evaluation pending - will provide supportive care with IVF, analgesia for pain control if needed  - CBC suggestive of anemia of acute blood loss and black stool noted today by pt - will obtain FOBT - CBC in AM - anemia panel  Anemia  - CBC in AM   DVT Prophylaxis - SCD's   Code Status - Full    LOS: 2 days   MAGICK-Melora Menon 01/24/2012, 5:23 PM  [facility] Pager: [phone]

## 2012-01-25 LAB — CBC
HCT: 27.4 % — ABNORMAL LOW (ref 36.0–46.0)
Hemoglobin: 9.3 g/dL — ABNORMAL LOW (ref 12.0–15.0)
MCH: 29.8 pg (ref 26.0–34.0)
MCHC: 33.9 g/dL (ref 30.0–36.0)
MCV: 87.8 fL (ref 78.0–100.0)
Platelets: 162 10*3/uL (ref 150–400)
RBC: 3.12 MIL/uL — ABNORMAL LOW (ref 3.87–5.11)
RDW: 15.6 % — ABNORMAL HIGH (ref 11.5–15.5)
WBC: 8.6 10*3/uL (ref 4.0–10.5)

## 2012-01-25 LAB — BASIC METABOLIC PANEL
BUN: 16 mg/dL (ref 6–23)
CO2: 31 mEq/L (ref 19–32)
Calcium: 9 mg/dL (ref 8.4–10.5)
Chloride: 98 mEq/L (ref 96–112)
Creatinine, Ser: 0.81 mg/dL (ref 0.50–1.10)
GFR calc Af Amer: 82 mL/min — ABNORMAL LOW (ref >90)
GFR calc non Af Amer: 70 mL/min — ABNORMAL LOW (ref >90)
Glucose, Bld: 96 mg/dL (ref 70–99)
Potassium: 3.9 mEq/L (ref 3.5–5.1)
Sodium: 135 mEq/L (ref 135–145)

## 2012-01-25 LAB — IRON AND TIBC
Iron: 31 ug/dL — ABNORMAL LOW (ref 42–135)
Saturation Ratios: 12 % — ABNORMAL LOW (ref 20–55)
TIBC: 267 ug/dL (ref 250–470)
UIBC: 236 ug/dL (ref 125–400)

## 2012-01-25 LAB — FOLATE: Folate: >20 ng/mL

## 2012-01-25 LAB — VITAMIN B12: Vitamin B-12: 497 pg/mL (ref 211–911)

## 2012-01-25 LAB — RETICULOCYTES
RBC.: 3.11 MIL/uL — ABNORMAL LOW (ref 3.87–5.11)
Retic Count, Absolute: 87.1 10*3/uL (ref 19.0–186.0)
Retic Ct Pct: 2.8 % (ref 0.4–3.1)

## 2012-01-25 LAB — GLUCOSE, CAPILLARY: Glucose-Capillary: 90 mg/dL (ref 70–99)

## 2012-01-25 LAB — FERRITIN: Ferritin: 32 ng/mL (ref 10–291)

## 2012-01-25 NOTE — Progress Notes (Signed)
OT Screen Order received, chart reviewed. Per nursing, pt is d/cing today and presents with no OT needs at this time. Will sign off. Please re-order if necessary.  Garrel Ridgel, OTR/L  Pager [phone] 01/25/2012

## 2012-01-25 NOTE — Discharge Summary (Signed)
Patient ID: Denise Hoffman MRN: [id number] DOB/AGE: 74/30/1937 74 y.o.  Admit date: 01/22/2012 Discharge date: 01/25/2012  Primary Care Physician:  Mickie Hillier, MD, MD  Discharge Diagnoses:  Dizziness, secondary to ? Acute blood loss anemia  Medication List  As of 01/25/2012 12:48 PM   TAKE these medications         ALPRAZolam 0.5 MG tablet   Commonly known as: XANAX   Take 0.5 mg by mouth at bedtime as needed.      aspirin 81 MG tablet   Take 81 mg by mouth daily.      calcium citrate-vitamin D 315-200 MG-UNIT per tablet   Commonly known as: CITRACAL+D   Take 1 tablet by mouth 2 (two) times daily.      cholecalciferol 1000 UNITS tablet   Commonly known as: VITAMIN D   Take 1,000 Units by mouth daily.      estradiol 1 MG tablet   Commonly known as: ESTRACE   Place 1 mg vaginally daily.      hydrochlorothiazide 25 MG tablet   Commonly known as: HYDRODIURIL   Take 25 mg by mouth daily.      lidocaine 5 %   Commonly known as: LIDODERM   Place 1 patch onto the skin daily. Remove & Discard patch within 12 hours or as directed by MD      losartan 50 MG tablet   Commonly known as: COZAAR   Take 50 mg by mouth daily.      lubiprostone 24 MCG capsule   Commonly known as: AMITIZA   Take 24 mcg by mouth daily with breakfast.      metoprolol 100 MG tablet   Commonly known as: LOPRESSOR   Take 100 mg by mouth 2 (two) times daily.      oxyCODONE 5 MG immediate release tablet   Commonly known as: Oxy IR/ROXICODONE   Take 5-10 mg by mouth every 4 (four) hours as needed. Post surgery pain            Disposition and Follow-up: With PCP in 2-4 weeks and with GI 02/02/2012 at 3:15 pm (Dr. Christella Hartigan)  Consults:  none  Significant Diagnostic Studies:  Dg Abd Acute W/chest 01/23/2012  *RADIOLOGY REPORT*  Clinical Data: Hernia repair 1 day ago.  Pain at the surgical site. Nausea and vomiting.  ACUTE ABDOMEN SERIES (ABDOMEN 2 VIEW & CHEST 1 VIEW)  Comparison:  10/24/2010  Findings: Shallow inspiration.  Normal heart size and pulmonary vascularity.  Linear atelectasis in the left lung base.  No blunting of costophrenic angles.  No pneumothorax.  Surgical clips in the upper abdomen.  Scattered gas and stool in the colon.  No small or large bowel distension.  No evidence of free intra-abdominal air.  No abnormal air fluid levels. Suggestion of biliary gas in the right upper quadrant, likely due to previous sphincterotomy.  This is stable since the previous study. Right mid abdominal calcifications are stable since the previous study.  IMPRESSION: Linear atelectasis in the left lung base.  Nonobstructive bowel gas pattern. Stable biliary gas, likely postoperative.  Original Report Authenticated By: Marlon Pel, M.D.    Brief H and P: Patient had a prior Whipple surgery in 2011 at [facility], and yesterday had surgery laparoscopically for hernia repair. This was done as an outpatient and the patient has been at home resting. She was taking Percocet for pain and apparently developed some associated nausea. She has been having episodes of feeling very lightheaded  and weak and feeling almost faint as well as episodes of some chills and sweats. She denies chest pain. She reports there is some pain in the epigastric region with coughing. She does have some upper abdominal discomfort which she attributes to her surgery. She denies any new worsening abdominal pain. She denies any documented fever. She has had a slight dry cough since yesterday. She reports prior to the surgery she had been feeling well. She does take medication for high blood pressure but denies any other significant past medical history. She reports that she had the Whipple procedure do to a large pancreatic cyst. She also knows that she has a cyst on one of her kidneys.  Physical Exam on Discharge:  Filed Vitals:   01/24/12 1429 01/24/12 2108 01/24/12 2200 01/25/12 0600  BP: 123/58 99/64 102/51 117/50    Pulse: 82 85 77 66  Temp: 99.1 F (37.3 C)  98.5 F (36.9 C) 98.2 F (36.8 C)  TempSrc: Oral  Oral Oral  Resp: 18  16 18   Height:      Weight:      SpO2: 100%  97% 96%     Intake/Output Summary (Last 24 hours) at 01/25/12 1248 Last data filed at 01/25/12 1042  Gross per 24 hour  Intake    483 ml  Output      0 ml  Net    483 ml    General: Alert, awake, oriented x3, in no acute distress. HEENT: No bruits, no goiter. Heart: Regular rate and rhythm, without murmurs, rubs, gallops. Lungs: Clear to auscultation bilaterally. Abdomen: Soft, nontender, nondistended, positive bowel sounds. Extremities: No clubbing cyanosis or edema with positive pedal pulses. Neuro: Grossly intact, nonfocal.  CBC:    Component Value Date/Time   WBC 8.6 01/25/2012 0504   HGB 9.3* 01/25/2012 0504   HCT 27.4* 01/25/2012 0504   PLT 162 01/25/2012 0504   MCV 87.8 01/25/2012 0504   NEUTROABS 8.1* 01/23/2012 0201   LYMPHSABS 1.5 01/23/2012 0201   MONOABS 0.3 01/23/2012 0201   EOSABS 0.0 01/23/2012 0201   BASOSABS 0.0 01/23/2012 0201    Basic Metabolic Panel:    Component Value Date/Time   NA 135 01/25/2012 0504   K 3.9 01/25/2012 0504   CL 98 01/25/2012 0504   CO2 31 01/25/2012 0504   BUN 16 01/25/2012 0504   CREATININE 0.81 01/25/2012 0504   GLUCOSE 96 01/25/2012 0504   CALCIUM 9.0 01/25/2012 0504    Hospital Course:   Syncope  - unclear etiology, pt is doing well clinically and denies any dizziness or syncopal events  - no events on telemetry during the stay - this was most likely secondary to dehydration, ? Blood loss given black tarry stools, FOBT negative - cardiac enzymes so far are negative and not suggestive of cardiac etiology  - PT evaluation with no recommendation - provided supportive care with IVF, analgesia for pain control if needed  - anemia panel suggestive of iron deficiency anemia, pt wants to try dietary management and no tablets for now - she will need further evaluation by GI  for ? colonoscopy   Time spent on Discharge: Over 30 minnutes  Signed: Debbora Presto 01/25/2012, 12:48 PM  [facility], pager #: [phone] Main office number: [phone]

## 2012-01-25 NOTE — Discharge Instructions (Signed)
Anemia, Frequently Asked Questions WHAT ARE THE SYMPTOMS OF ANEMIA?  Headache.   Difficulty thinking.   Fatigue.   Shortness of breath.   Weakness.   Rapid heartbeat.  AT WHAT POINT ARE PEOPLE CONSIDERED ANEMIC?  This varies with gender and age.   Both hemoglobin (Hgb) and hematocrit values are used to define anemia. These lab values are obtained from a complete blood count (CBC) test. This is performed at a caregiver's office.   The normal range of hemoglobin values for adult men is 14.0 g/dL to 17.4 g/dL. For nonpregnant women, values are 12.3 g/dL to 15.3 g/dL.   The World Health Organization defines anemia as less than 12 g/dL for nonpregnant women and less than 13 g/dL for men.   For adult males, the average normal hematocrit is 46%, and the range is 40% to 52%.   For adult females, the average normal hematocrit is 41%, and the range is 35% to 47%.   Values that fall below the lower limits can be a sign of anemia and should have further checking (evaluation).  GROUPS OF PEOPLE WHO ARE AT RISK FOR DEVELOPING ANEMIA INCLUDE:   Infants who are breastfed or taking a formula that is not fortified with iron.   Children going through a rapid growth spurt. The iron available can not keep up with the needs for a red cell mass which must grow with the child.   Women in childbearing years. They need iron because of blood loss during menstruation.   Pregnant women. The growing fetus creates a high demand for iron.   People with ongoing gastrointestinal blood loss are at risk of developing iron deficiency.   Individuals with leukemia or cancer who must receive chemotherapy or radiation to treat their disease. The drugs or radiation used to treat these diseases often decreases the bone marrow's ability to make cells of all classes. This includes red blood cells, white blood cells, and platelets.   Individuals with chronic inflammatory conditions such as rheumatoid arthritis or  chronic infections.   The elderly.  ARE SOME TYPES OF ANEMIA INHERITED?   Yes, some types of anemia are due to inherited or genetic defects.   Sickle cell anemia. This occurs most often in people of African, African American, and Mediterranean descent.   Thalassemia (or Cooley's anemia). This type is found in people of Mediterranean and Southeast Asian descent. These types of anemia are common.   [place]. This is rare.  CAN CERTAIN MEDICATIONS CAUSE A PERSON TO BECOME ANEMIC?  Yes. For example, drugs to fight cancer (chemotherapeutic agents) often cause anemia. These drugs can slow the bone marrow's ability to make red blood cells. If there are not enough red blood cells, the body does not get enough oxygen. WHAT HEMATOCRIT LEVEL IS REQUIRED TO DONATE BLOOD?  The lower limit of an acceptable hematocrit for blood donors is 38%. If you have a low hematocrit value, you should schedule an appointment with your caregiver. ARE BLOOD TRANSFUSIONS COMMONLY USED TO CORRECT ANEMIA, AND ARE THEY DANGEROUS?  They are used to treat anemia as a last resort. Your caregiver will find the cause of the anemia and correct it if possible. Most blood transfusions are given because of excessive bleeding at the time of surgery, with trauma, or because of bone marrow suppression in patients with cancer or leukemia on chemotherapy. Blood transfusions are safer than ever before. We also know that blood transfusions affect the immune system and may increase certain risks. There is   also a concern for human error. In 1/16,000 transfusions, a patient receives a transfusion of blood that is not matched with his or her blood type.  WHAT IS IRON DEFICIENCY ANEMIA AND CAN I CORRECT IT BY CHANGING MY DIET?  Iron is an essential part of hemoglobin. Without enough hemoglobin, anemia develops and the body does not get the right amount of oxygen. Iron deficiency anemia develops after the body has had a low level of iron for a long  time. This is either caused by blood loss, not taking in or absorbing enough iron, or increased demands for iron (like pregnancy or rapid growth).  Foods from animal origin such as beef, chicken, and pork, are good sources of iron. Be sure to have one of these foods at each meal. Vitamin C helps your body absorb iron. Foods rich in Vitamin C include citrus, bell pepper, strawberries, spinach and cantaloupe. In some cases, iron supplements may be needed in order to correct the iron deficiency. In the case of poor absorption, extra iron may have to be given directly into the vein through a needle (intravenously). I HAVE BEEN DIAGNOSED WITH IRON DEFICIENCY ANEMIA AND MY CAREGIVER PRESCRIBED IRON SUPPLEMENTS. HOW LONG WILL IT TAKE FOR MY BLOOD TO BECOME NORMAL?  It depends on the degree of anemia at the beginning of treatment. Most people with mild to moderate iron deficiency, anemia will correct the anemia over a period of 2 to 3 months. But after the anemia is corrected, the iron stored by the body is still low. Caregivers often suggest an additional 6 months of oral iron therapy once the anemia has been reversed. This will help prevent the iron deficiency anemia from quickly happening again. Non-anemic adult males should take iron supplements only under the direction of a doctor, too much iron can cause liver damage.  MY HEMOGLOBIN IS 9 G/DL AND I AM SCHEDULED FOR SURGERY. SHOULD I POSTPONE THE SURGERY?  If you have Hgb of 9, you should discuss this with your caregiver right away. Many patients with similar hemoglobin levels have had surgery without problems. If minimal blood loss is expected for a minor procedure, no treatment may be necessary.  If a greater blood loss is expected for more extensive procedures, you should ask your caregiver about being treated with erythropoietin and iron. This is to accelerate the recovery of your hemoglobin to a normal level before surgery. An anemic patient who undergoes  high-blood-loss surgery has a greater risk of surgical complications and need for a blood transfusion, which also carries some risk.  I HAVE BEEN TOLD THAT HEAVY MENSTRUAL PERIODS CAUSE ANEMIA. IS THERE ANYTHING I CAN DO TO PREVENT THE ANEMIA?  Anemia that results from heavy periods is usually due to iron deficiency. You can try to meet the increased demands for iron caused by the heavy monthly blood loss by increasing the intake of iron-rich foods. Iron supplements may be required. Discuss your concerns with your caregiver. WHAT CAUSES ANEMIA DURING PREGNANCY?  Pregnancy places major demands on the body. The mother must meet the needs of both her body and her growing baby. The body needs enough iron and folate to make the right amount of red blood cells. To prevent anemia while pregnant, the mother should stay in close contact with her caregiver.  Be sure to eat a diet that has foods rich in iron and folate like liver and dark green leafy vegetables. Folate plays an important role in the normal development of a baby's   spinal cord. Folate can help prevent serious disorders like spina bifida. If your diet does not provide adequate nutrients, you may want to talk with your caregiver about nutritional supplements.  WHAT IS THE RELATIONSHIP BETWEEN FIBROID TUMORS AND ANEMIA IN WOMEN?  The relationship is usually caused by the increased menstrual blood loss caused by fibroids. Good iron intake may be required to prevent iron deficiency anemia from developing.  Document Released: 04/29/2004 Document Revised: 09/10/2011 Document Reviewed: 10/14/2010 [facility] Patient Information 2012 [place], [place].  Iron-Rich Diet An iron-rich diet contains foods that are good sources of iron. Iron is an important mineral that helps your body produce hemoglobin. Hemoglobin is a protein in red blood cells that carries oxygen to the body's tissues. Sometimes, the iron level in your blood can be low. This may be caused by:  A  lack of iron in your diet.   Blood loss.   Times of growth, such as during pregnancy or during a child's growth and development.  Low levels of iron can cause a decrease in the number of red blood cells. This can result in iron deficiency anemia. Iron deficiency anemia symptoms include:  Tiredness.   Weakness.   Irritability.   Increased chance of infection.  Here are some recommendations for daily iron intake:  Males older than 74 years of age need 8 mg of iron per day.   Women ages 21 to 17 need 18 mg of iron per day.   Pregnant women need 27 mg of iron per day, and women who are over 34 years of age and breastfeeding need 9 mg of iron per day.   Women over the age of 38 need 8 mg of iron per day.  SOURCES OF IRON There are 2 types of iron that are found in food: heme iron and nonheme iron. Heme iron is absorbed by the body better than nonheme iron. Heme iron is found in meat, poultry, and fish. Nonheme iron is found in grains, beans, and vegetables. Heme Iron Sources Food / Iron (mg)  Chicken liver, 3 oz (85 g)/ 10 mg   Beef liver, 3 oz (85 g)/ 5.5 mg   Oysters, 3 oz (85 g)/ 8 mg   Beef, 3 oz (85 g)/ 2 to 3 mg   Shrimp, 3 oz (85 g)/ 2.8 mg   [place], 3 oz (85 g)/ 2 mg   Chicken, 3 oz (85 g) / 1 mg   Fish (tuna, halibut), 3 oz (85 g)/ 1 mg   Pork, 3 oz (85 g)/ 0.9 mg  Nonheme Iron Sources Food / Iron (mg)  Ready-to-eat breakfast cereal, iron-fortified / 3.9 to 7 mg   Tofu,  cup / 3.4 mg   Kidney beans,  cup / 2.6 mg   Baked potato with skin / 2.7 mg   Asparagus,  cup / 2.2 mg   Avocado / 2 mg   Dried peaches,  cup / 1.6 mg   Raisins,  cup / 1.5 mg   Soy milk, 1 cup / 1.5 mg   Whole-wheat bread, 1 slice / 1.2 mg   Spinach, 1 cup / 0.8 mg   Broccoli,  cup / 0.6 mg  IRON ABSORPTION Certain foods can decrease the body's absorption of iron. Try to avoid these foods and beverages while eating meals with iron-containing foods:  Coffee.    Tea.   Fiber.   Soy.  Foods containing vitamin C can help increase the amount of iron your body absorbs from iron  sources, especially from nonheme sources. Eat foods with vitamin C along with iron-containing foods to increase your iron absorption. Foods that are high in vitamin C include many fruits and vegetables. Some good sources are:  Fresh orange juice.   Oranges.   Strawberries.   Mangoes.   Grapefruit.   Red bell peppers.   Green bell peppers.   Broccoli.   Potatoes with skin.   Tomato juice.  Document Released: 05/05/2005 Document Revised: 09/10/2011 Document Reviewed: 03/12/2011 [facility] Patient Information 2012 [place], [place].  Recipe for swiss chard 1. Cook Environmental manager for 10 minutes in water with little salt 2. Take it out and cut in small pieces 3. 3 cloves of garlic cut in small pieces 4. 2-3 spoons of olive oil, place in pan and add garlic to it 5. After about 1 minute add swiss chard and cook for additional 3-4 minutes Good and delicious meal! Enjoy

## 2012-01-27 ENCOUNTER — Encounter: Payer: Self-pay | Admitting: Gastroenterology

## 2012-01-27 ENCOUNTER — Inpatient Hospital Stay (HOSPITAL_BASED_OUTPATIENT_CLINIC_OR_DEPARTMENT_OTHER)
Admission: EM | Admit: 2012-01-27 | Discharge: 2012-01-30 | DRG: 378 | Disposition: A | Discharge status: Home / Self Care | Payer: Medicare Other | Attending: Internal Medicine | Admitting: Internal Medicine

## 2012-01-27 ENCOUNTER — Emergency Department (INDEPENDENT_AMBULATORY_CARE_PROVIDER_SITE_OTHER): Payer: Medicare Other

## 2012-01-27 ENCOUNTER — Encounter (HOSPITAL_BASED_OUTPATIENT_CLINIC_OR_DEPARTMENT_OTHER): Payer: Self-pay | Admitting: Emergency Medicine

## 2012-01-27 DIAGNOSIS — K5731 Diverticulosis of large intestine without perforation or abscess with bleeding: Secondary | ICD-10-CM | POA: Diagnosis not present

## 2012-01-27 DIAGNOSIS — R079 Chest pain, unspecified: Secondary | ICD-10-CM

## 2012-01-27 DIAGNOSIS — R0789 Other chest pain: Secondary | ICD-10-CM | POA: Diagnosis not present

## 2012-01-27 DIAGNOSIS — E876 Hypokalemia: Secondary | ICD-10-CM | POA: Diagnosis not present

## 2012-01-27 DIAGNOSIS — R55 Syncope and collapse: Secondary | ICD-10-CM

## 2012-01-27 DIAGNOSIS — R0602 Shortness of breath: Secondary | ICD-10-CM

## 2012-01-27 DIAGNOSIS — J9819 Other pulmonary collapse: Secondary | ICD-10-CM

## 2012-01-27 DIAGNOSIS — F411 Generalized anxiety disorder: Secondary | ICD-10-CM | POA: Diagnosis present

## 2012-01-27 DIAGNOSIS — I10 Essential (primary) hypertension: Secondary | ICD-10-CM | POA: Diagnosis present

## 2012-01-27 DIAGNOSIS — K922 Gastrointestinal hemorrhage, unspecified: Secondary | ICD-10-CM | POA: Diagnosis present

## 2012-01-27 DIAGNOSIS — Z9889 Other specified postprocedural states: Secondary | ICD-10-CM

## 2012-01-27 DIAGNOSIS — D62 Acute posthemorrhagic anemia: Secondary | ICD-10-CM | POA: Diagnosis present

## 2012-01-27 DIAGNOSIS — D649 Anemia, unspecified: Secondary | ICD-10-CM | POA: Diagnosis present

## 2012-01-27 HISTORY — DX: Essential (primary) hypertension: I10

## 2012-01-27 HISTORY — DX: Unspecified abdominal hernia without obstruction or gangrene: K46.9

## 2012-01-27 LAB — COMPREHENSIVE METABOLIC PANEL
ALT: 16 U/L (ref 0–35)
AST: 17 U/L (ref 0–37)
Albumin: 3.5 g/dL (ref 3.5–5.2)
Alkaline Phosphatase: 91 U/L (ref 39–117)
BUN: 17 mg/dL (ref 6–23)
CO2: 28 mEq/L (ref 19–32)
Calcium: 9.5 mg/dL (ref 8.4–10.5)
Chloride: 96 mEq/L (ref 96–112)
Creatinine, Ser: 0.9 mg/dL (ref 0.50–1.10)
GFR calc Af Amer: 72 mL/min — ABNORMAL LOW (ref >90)
GFR calc non Af Amer: 62 mL/min — ABNORMAL LOW (ref >90)
Glucose, Bld: 127 mg/dL — ABNORMAL HIGH (ref 70–99)
Potassium: 3.4 mEq/L — ABNORMAL LOW (ref 3.5–5.1)
Sodium: 134 mEq/L — ABNORMAL LOW (ref 135–145)
Total Bilirubin: 0.2 mg/dL — ABNORMAL LOW (ref 0.3–1.2)
Total Protein: 6.4 g/dL (ref 6.0–8.3)

## 2012-01-27 LAB — DIFFERENTIAL
Basophils Absolute: 0 10*3/uL (ref 0.0–0.1)
Basophils Relative: 0 % (ref 0–1)
Eosinophils Absolute: 0.2 10*3/uL (ref 0.0–0.7)
Eosinophils Relative: 2 % (ref 0–5)
Lymphocytes Relative: 23 % (ref 12–46)
Lymphs Abs: 2.3 10*3/uL (ref 0.7–4.0)
Monocytes Absolute: 1.1 10*3/uL — ABNORMAL HIGH (ref 0.1–1.0)
Monocytes Relative: 11 % (ref 3–12)
Neutro Abs: 6.5 10*3/uL (ref 1.7–7.7)
Neutrophils Relative %: 65 % (ref 43–77)

## 2012-01-27 LAB — CBC
HCT: 27.5 % — ABNORMAL LOW (ref 36.0–46.0)
Hemoglobin: 9.2 g/dL — ABNORMAL LOW (ref 12.0–15.0)
MCH: 29.7 pg (ref 26.0–34.0)
MCHC: 33.5 g/dL (ref 30.0–36.0)
MCV: 88.7 fL (ref 78.0–100.0)
Platelets: 229 10*3/uL (ref 150–400)
RBC: 3.1 MIL/uL — ABNORMAL LOW (ref 3.87–5.11)
RDW: 14.2 % (ref 11.5–15.5)
WBC: 10 10*3/uL (ref 4.0–10.5)

## 2012-01-27 LAB — OCCULT BLOOD X 1 CARD TO LAB, STOOL: Fecal Occult Bld: POSITIVE

## 2012-01-27 LAB — D-DIMER, QUANTITATIVE: D-Dimer, Quant: 0.7 ug/mL-FEU — ABNORMAL HIGH (ref 0.00–0.48)

## 2012-01-27 LAB — CARDIAC PANEL(CRET KIN+CKTOT+MB+TROPI)
CK, MB: 2.3 ng/mL (ref 0.3–4.0)
Relative Index: INVALID (ref 0.0–2.5)
Total CK: 63 U/L (ref 7–177)
Troponin I: <0.3 ng/mL (ref <0.30)

## 2012-01-27 MED ORDER — IOHEXOL 350 MG/ML SOLN
80.0000 mL | Freq: Once | INTRAVENOUS | Status: AC | PRN
  Administered 2012-01-27: 80 mL via INTRAVENOUS

## 2012-01-27 NOTE — ED Provider Notes (Addendum)
History     CSN: [id number]  Arrival date & time 01/27/12  2148   None     Chief Complaint  Patient presents with  . Chest Pain    (Consider location/radiation/quality/duration/timing/severity/associated sxs/prior treatment) HPI Comments: Patient is a 76 year old woman who comes in and through her for a more. She said that last week she had a ventral hernia repair as an outpatient at [facility]. She got home, but had gotten weak feeling on Friday, 4 days ago. She was hospitalized at [facility] at that time, was found to be anemic, and was transfused 2 units of blood. She was released from [facility] 2 days ago. She seemed to do well yesterday, but today has developed chest pressure and therefore seeks reevaluation.  Patient is a 76 y.o. female presenting with chest pain. The history is provided by the patient and medical records. No language interpreter was used.  Chest Pain The chest pain began 3 - 5 hours ago. Chest pain occurs constantly. The chest pain is unchanged. Associated with: Nothing. At its most intense, the pain is at 6/10. The pain is currently at 6/10. The severity of the pain is moderate. The quality of the pain is described as pressure-like. The pain radiates to the upper back. Primary symptoms include fatigue and syncope. Pertinent negatives for primary symptoms include no fever.  Before the onset of the syncopal episode there was weakness.  Associated symptoms include weakness.  Pertinent negatives for associated symptoms include no diaphoresis and no near-syncope. She tried nothing for the symptoms. Risk factors include being elderly (Recent operation for hernia repair.).  Her past medical history is significant for hypertension. Past medical history comments: Pancreatic cyst treated with a Whipple operation 15 months ago.     Past Medical History  Diagnosis Date  . Pancreatic cyst   . Hernia   . Hypertension     Past  Surgical History  Procedure Date  . Stomach surgery   . Hernia repair     left hernia    Family History  Problem Relation Age of Onset  . Breast cancer Sister   . Stroke Mother   . Colon cancer Brother     History  Substance Use Topics  . Smoking status: Never Smoker   . Smokeless tobacco: Never Used  . Alcohol Use: No    OB History    Grav Para Term Preterm Abortions TAB SAB Ect Mult Living                  Review of Systems  Constitutional: Positive for fatigue. Negative for fever and diaphoresis.  HENT: Negative.   Respiratory: Negative.   Cardiovascular: Positive for chest pain and syncope. Negative for near-syncope.  Gastrointestinal: Negative.        Recent hernia surgery/  Genitourinary: Negative.   Musculoskeletal: Negative.   Neurological: Positive for weakness.  Psychiatric/Behavioral: Negative.     Allergies  WUJ:WJXBJYNWGNF+AOZHYQMVH+QIONGEXBMW acid+aspartame; Morphine; Prednisone; and Sulfonamide derivatives  Home Medications   Current Outpatient Rx  Name Route Sig Dispense Refill  . ALPRAZOLAM 0.5 MG PO TABS Oral Take 0.5 mg by mouth at bedtime as needed.    . ASPIRIN 81 MG PO TABS Oral Take 81 mg by mouth daily.    Marland Kitchen CALCIUM CITRATE-VITAMIN D 315-200 MG-UNIT PO TABS Oral Take 1 tablet by mouth 2 (two) times daily.    Marland Kitchen VITAMIN D 1000 UNITS PO TABS Oral Take 1,000 Units by mouth daily.    Marland Kitchen  ESTRADIOL 1 MG PO TABS Vaginal Place 1 mg vaginally daily.     Marland Kitchen HYDROCHLOROTHIAZIDE 25 MG PO TABS Oral Take 25 mg by mouth daily.    Marland Kitchen LIDOCAINE 5 % EX PTCH Transdermal Place 1 patch onto the skin daily. Remove & Discard patch within 12 hours or as directed by MD    . Claris Gladden POTASSIUM 50 MG PO TABS Oral Take 50 mg by mouth daily.    . LUBIPROSTONE 24 MCG PO CAPS Oral Take 24 mcg by mouth daily with breakfast.    . METOPROLOL TARTRATE 100 MG PO TABS Oral Take 100 mg by mouth 2 (two) times daily.    . OXYCODONE HCL 5 MG PO TABS Oral Take 5-10 mg by mouth  every 4 (four) hours as needed. Post surgery pain      BP 127/59  Pulse 90  Temp(Src) 98.4 F (36.9 C) (Oral)  Resp 18  Ht 5\' 4"  (1.626 m)  Wt 143 lb (64.864 kg)  BMI 24.55 kg/m2  SpO2 99%  Physical Exam  Nursing note and vitals reviewed. Constitutional: She is oriented to person, place, and time.       Pleasant elderly lady in no distress at rest.  HENT:  Head: Normocephalic and atraumatic.  Right Ear: External ear normal.  Left Ear: External ear normal.  Mouth/Throat: Oropharynx is clear and moist.  Eyes: Conjunctivae and EOM are normal. Pupils are equal, round, and reactive to light. No scleral icterus.  Neck: Normal range of motion. Neck supple.  Cardiovascular: Normal rate, regular rhythm and normal heart sounds.   Pulmonary/Chest: Effort normal and breath sounds normal.  Abdominal: Soft. Bowel sounds are normal.       Wounds from laparoscopic hernia repair, with some surrounding ecchymosis, healing well without infection.  Genitourinary:       Rectal exam shows no mass or tenderness.  Stool submitted for hemoccult.  Musculoskeletal: Normal range of motion. She exhibits no edema and no tenderness.  Neurological: She is alert and oriented to person, place, and time.       No sensory or motor deficit.  Skin: Skin is warm and dry.  Psychiatric: She has a normal mood and affect. Her behavior is normal.    ED Course  Procedures (including critical care time)   Labs Reviewed  CBC  DIFFERENTIAL  COMPREHENSIVE METABOLIC PANEL  CARDIAC PANEL(CRET KIN+CKTOT+MB+TROPI)  D-DIMER, QUANTITATIVE   10:23 PM  Date: 01/27/2012  Rate:82  Rhythm: normal sinus rhythm  QRS Axis: normal  Intervals: normal  ST/T Wave abnormalities: normal  Conduction Disutrbances:none  Narrative Interpretation: Normal EKG  Old EKG Reviewed: unchanged  10:42 PM Patient seen and had physical examination. Old charts were reviewed. EKG was benign. Laboratory tests were ordered.  11:28  PM Results for orders placed during the hospital encounter of 01/27/12  CBC      Component Value Range   WBC 10.0  4.0 - 10.5 (K/uL)   RBC 3.10 (*) 3.87 - 5.11 (MIL/uL)   Hemoglobin 9.2 (*) 12.0 - 15.0 (g/dL)   HCT 96.0 (*) 45.4 - 46.0 (%)   MCV 88.7  78.0 - 100.0 (fL)   MCH 29.7  26.0 - 34.0 (pg)   MCHC 33.5  30.0 - 36.0 (g/dL)   RDW 09.8  11.9 - 14.7 (%)   Platelets 229  150 - 400 (K/uL)  DIFFERENTIAL      Component Value Range   Neutrophils Relative 65  43 - 77 (%)   Neutro Abs  6.5  1.7 - 7.7 (K/uL)   Lymphocytes Relative 23  12 - 46 (%)   Lymphs Abs 2.3  0.7 - 4.0 (K/uL)   Monocytes Relative 11  3 - 12 (%)   Monocytes Absolute 1.1 (*) 0.1 - 1.0 (K/uL)   Eosinophils Relative 2  0 - 5 (%)   Eosinophils Absolute 0.2  0.0 - 0.7 (K/uL)   Basophils Relative 0  0 - 1 (%)   Basophils Absolute 0.0  0.0 - 0.1 (K/uL)  COMPREHENSIVE METABOLIC PANEL      Component Value Range   Sodium 134 (*) 135 - 145 (mEq/L)   Potassium 3.4 (*) 3.5 - 5.1 (mEq/L)   Chloride 96  96 - 112 (mEq/L)   CO2 28  19 - 32 (mEq/L)   Glucose, Bld 127 (*) 70 - 99 (mg/dL)   BUN 17  6 - 23 (mg/dL)   Creatinine, Ser 9.60  0.50 - 1.10 (mg/dL)   Calcium 9.5  8.4 - 45.4 (mg/dL)   Total Protein 6.4  6.0 - 8.3 (g/dL)   Albumin 3.5  3.5 - 5.2 (g/dL)   AST 17  0 - 37 (U/L)   ALT 16  0 - 35 (U/L)   Alkaline Phosphatase 91  39 - 117 (U/L)   Total Bilirubin 0.2 (*) 0.3 - 1.2 (mg/dL)   GFR calc non Af Amer 62 (*) >90 (mL/min)   GFR calc Af Amer 72 (*) >90 (mL/min)  CARDIAC PANEL(CRET KIN+CKTOT+MB+TROPI)      Component Value Range   Total CK 63  7 - 177 (U/L)   CK, MB 2.3  0.3 - 4.0 (ng/mL)   Troponin I <0.30  <0.30 (ng/mL)   Relative Index RELATIVE INDEX IS INVALID  0.0 - 2.5   D-DIMER, QUANTITATIVE      Component Value Range   D-Dimer, Quant 0.70 (*) 0.00 - 0.48 (ug/mL-FEU)   Dg Chest 2 View  01/27/2012  *RADIOLOGY REPORT*  Clinical Data: Mid chest pressure.  CHEST - 2 VIEW  Comparison: 12/22/2011 at [facility].  Findings: Normal heart size and pulmonary vascularity. Emphysematous changes and fibrosis in the lungs.  New areas of linear atelectasis in the mid lungs.  No focal airspace consolidation is appreciated.  No blunting of costophrenic angles. No pneumothorax.  Tortuous aorta.  Mild degenerative changes in the spine.  Surgical clips in the right upper quadrant.  IMPRESSION: No evidence of active pulmonary disease.  Interval development of linear atelectasis in the mid lungs.  Original Report Authenticated By: Marlon Pel, M.D.   Dg Abd Acute W/chest  01/23/2012  *RADIOLOGY REPORT*  Clinical Data: Hernia repair 1 day ago.  Pain at the surgical site. Nausea and vomiting.  ACUTE ABDOMEN SERIES (ABDOMEN 2 VIEW & CHEST 1 VIEW)  Comparison: 10/24/2010  Findings: Shallow inspiration.  Normal heart size and pulmonary vascularity.  Linear atelectasis in the left lung base.  No blunting of costophrenic angles.  No pneumothorax.  Surgical clips in the upper abdomen.  Scattered gas and stool in the colon.  No small or large bowel distension.  No evidence of free intra-abdominal air.  No abnormal air fluid levels. Suggestion of biliary gas in the right upper quadrant, likely due to previous sphincterotomy.  This is stable since the previous study. Right mid abdominal calcifications are stable since the previous study.  IMPRESSION: Linear atelectasis in the left lung base.  Nonobstructive bowel gas pattern. Stable biliary gas, likely postoperative.  Original Report Authenticated By:  Marlon Pel, M.D.    Lab results reviewed.  CBC showed a white count of 10,000, hematocrit of 27.5 and hemoglobin 9.2. These are comparable to her prior results of his CBC on 01/25/2012. Blood sugar was mildly elevated at 127. Electrolytes BUN and creatinine and liver function tests were normal. Cardiac markers were negative. D-dimer was elevated at 0.7. A chest x-ray showed no evidence of acute disease. Where she is  recently postop and has had chest pressure, we will do a CT and chills her chest to check for pulmonary embolism.  12:28 AM CT angio of chest showed no pulmonary embolism.  Stool was positive for blood. Will contact [facility] at [facility], as she was discharged from there 2 days ago, to request that they observe her and complete her GI workup.  .12:54 AM Case discussed with Dr. Izola Price --> admit to a telemetry unit at [facility].   1. GI bleeding   2. Chest pressure             Carleene Cooper III, MD 01/27/12 1610  Carleene Cooper III, MD 01/28/12 [phone]

## 2012-01-27 NOTE — ED Notes (Signed)
Pt c/o chest pressure with shob. Pt had hernia repair 1 week ago, pt was seen at [facility] for hypotension and anemia last fri. Pt reports symptoms started about 3 hours ago while sitting in chair

## 2012-01-28 ENCOUNTER — Encounter (HOSPITAL_COMMUNITY)
Admission: EM | Disposition: A | Discharge status: Home / Self Care | Payer: Self-pay | Source: Home / Self Care | Attending: Internal Medicine

## 2012-01-28 ENCOUNTER — Encounter (HOSPITAL_COMMUNITY): Payer: Self-pay | Admitting: Internal Medicine

## 2012-01-28 DIAGNOSIS — R112 Nausea with vomiting, unspecified: Secondary | ICD-10-CM | POA: Diagnosis not present

## 2012-01-28 DIAGNOSIS — R079 Chest pain, unspecified: Secondary | ICD-10-CM | POA: Diagnosis not present

## 2012-01-28 DIAGNOSIS — I10 Essential (primary) hypertension: Secondary | ICD-10-CM | POA: Diagnosis present

## 2012-01-28 DIAGNOSIS — K439 Ventral hernia without obstruction or gangrene: Secondary | ICD-10-CM | POA: Diagnosis not present

## 2012-01-28 DIAGNOSIS — D649 Anemia, unspecified: Secondary | ICD-10-CM | POA: Diagnosis present

## 2012-01-28 DIAGNOSIS — E876 Hypokalemia: Secondary | ICD-10-CM | POA: Diagnosis not present

## 2012-01-28 DIAGNOSIS — R0789 Other chest pain: Secondary | ICD-10-CM

## 2012-01-28 DIAGNOSIS — K5731 Diverticulosis of large intestine without perforation or abscess with bleeding: Secondary | ICD-10-CM | POA: Diagnosis not present

## 2012-01-28 DIAGNOSIS — D62 Acute posthemorrhagic anemia: Secondary | ICD-10-CM | POA: Diagnosis present

## 2012-01-28 DIAGNOSIS — Z9889 Other specified postprocedural states: Secondary | ICD-10-CM | POA: Diagnosis not present

## 2012-01-28 DIAGNOSIS — F411 Generalized anxiety disorder: Secondary | ICD-10-CM | POA: Diagnosis present

## 2012-01-28 DIAGNOSIS — K3189 Other diseases of stomach and duodenum: Secondary | ICD-10-CM | POA: Diagnosis not present

## 2012-01-28 DIAGNOSIS — K922 Gastrointestinal hemorrhage, unspecified: Secondary | ICD-10-CM

## 2012-01-28 HISTORY — PX: ESOPHAGOGASTRODUODENOSCOPY: SHX5428

## 2012-01-28 LAB — DIFFERENTIAL
Basophils Absolute: 0 K/uL (ref 0.0–0.1)
Basophils Relative: 0 % (ref 0–1)
Eosinophils Absolute: 0.2 K/uL (ref 0.0–0.7)
Eosinophils Relative: 2 % (ref 0–5)
Lymphocytes Relative: 19 % (ref 12–46)
Lymphs Abs: 1.5 K/uL (ref 0.7–4.0)
Monocytes Absolute: 0.8 K/uL (ref 0.1–1.0)
Monocytes Relative: 11 % (ref 3–12)
Neutro Abs: 5.3 K/uL (ref 1.7–7.7)
Neutrophils Relative %: 68 % (ref 43–77)

## 2012-01-28 LAB — PROTIME-INR
INR: 0.95 (ref 0.00–1.49)
Prothrombin Time: 12.9 s (ref 11.6–15.2)

## 2012-01-28 LAB — TYPE AND SCREEN
ABO/RH(D): O POS
Antibody Screen: NEGATIVE

## 2012-01-28 LAB — COMPREHENSIVE METABOLIC PANEL WITH GFR
ALT: 13 U/L (ref 0–35)
AST: 15 U/L (ref 0–37)
Albumin: 3.1 g/dL — ABNORMAL LOW (ref 3.5–5.2)
Alkaline Phosphatase: 80 U/L (ref 39–117)
BUN: 12 mg/dL (ref 6–23)
CO2: 29 meq/L (ref 19–32)
Calcium: 9 mg/dL (ref 8.4–10.5)
Chloride: 102 meq/L (ref 96–112)
Creatinine, Ser: 0.77 mg/dL (ref 0.50–1.10)
GFR calc Af Amer: >90 mL/min
GFR calc non Af Amer: 81 mL/min — ABNORMAL LOW
Glucose, Bld: 105 mg/dL — ABNORMAL HIGH (ref 70–99)
Potassium: 3.5 meq/L (ref 3.5–5.1)
Sodium: 136 meq/L (ref 135–145)
Total Bilirubin: 0.3 mg/dL (ref 0.3–1.2)
Total Protein: 5.9 g/dL — ABNORMAL LOW (ref 6.0–8.3)

## 2012-01-28 LAB — CARDIAC PANEL(CRET KIN+CKTOT+MB+TROPI)
CK, MB: 2.5 ng/mL (ref 0.3–4.0)
CK, MB: 2.4 ng/mL (ref 0.3–4.0)
CK, MB: 2.2 ng/mL (ref 0.3–4.0)
Relative Index: INVALID (ref 0.0–2.5)
Relative Index: INVALID (ref 0.0–2.5)
Relative Index: INVALID (ref 0.0–2.5)
Total CK: 58 U/L (ref 7–177)
Total CK: 54 U/L (ref 7–177)
Total CK: 57 U/L (ref 7–177)
Troponin I: <0.3 ng/mL
Troponin I: <0.3 ng/mL
Troponin I: <0.3 ng/mL

## 2012-01-28 LAB — CBC
HCT: 26.7 % — ABNORMAL LOW (ref 36.0–46.0)
Hemoglobin: 8.9 g/dL — ABNORMAL LOW (ref 12.0–15.0)
MCH: 29.8 pg (ref 26.0–34.0)
MCHC: 33.3 g/dL (ref 30.0–36.0)
MCV: 89.3 fL (ref 78.0–100.0)
Platelets: 227 K/uL (ref 150–400)
RBC: 2.99 MIL/uL — ABNORMAL LOW (ref 3.87–5.11)
RDW: 14.6 % (ref 11.5–15.5)
WBC: 7.8 K/uL (ref 4.0–10.5)

## 2012-01-28 LAB — LIPASE, BLOOD: Lipase: 16 U/L (ref 11–59)

## 2012-01-28 SURGERY — EGD (ESOPHAGOGASTRODUODENOSCOPY)
Anesthesia: Moderate Sedation

## 2012-01-28 MED ORDER — FENTANYL CITRATE 0.05 MG/ML IJ SOLN
INTRAMUSCULAR | Status: AC
  Filled 2012-01-28: qty 2

## 2012-01-28 MED ORDER — PANTOPRAZOLE SODIUM 40 MG IV SOLR
40.0000 mg | Freq: Once | INTRAVENOUS | Status: AC
  Administered 2012-01-28: 40 mg via INTRAVENOUS
  Filled 2012-01-28: qty 40

## 2012-01-28 MED ORDER — ONDANSETRON HCL 4 MG PO TABS: 4.0000 mg | ORAL_TABLET | Freq: Four times a day (QID) | ORAL | Status: DC | PRN

## 2012-01-28 MED ORDER — VITAMIN D3 25 MCG (1000 UNIT) PO TABS
1000.0000 [IU] | ORAL_TABLET | Freq: Every day | ORAL | Status: DC
  Administered 2012-01-28 – 2012-01-30 (×3): 1000 [IU] via ORAL
  Filled 2012-01-28 (×3): qty 1

## 2012-01-28 MED ORDER — PEG-KCL-NACL-NASULF-NA ASC-C 100 G PO SOLR
1.0000 | Freq: Once | ORAL | Status: AC
  Administered 2012-01-29: 100 g via ORAL
  Filled 2012-01-28: qty 1

## 2012-01-28 MED ORDER — SODIUM CHLORIDE 0.9 % IV SOLN
INTRAVENOUS | Status: DC
  Administered 2012-01-28: 500 mL via INTRAVENOUS

## 2012-01-28 MED ORDER — LIDOCAINE 5 % EX PTCH
1.0000 | MEDICATED_PATCH | Freq: Every day | CUTANEOUS | Status: DC
  Administered 2012-01-28 – 2012-01-30 (×3): 1 via TRANSDERMAL
  Filled 2012-01-28 (×4): qty 1

## 2012-01-28 MED ORDER — FENTANYL NICU IV SYRINGE 50 MCG/ML
INJECTION | INTRAMUSCULAR | Status: DC | PRN
  Administered 2012-01-28: 25 ug via INTRAVENOUS

## 2012-01-28 MED ORDER — PANTOPRAZOLE SODIUM 40 MG IV SOLR
40.0000 mg | Freq: Two times a day (BID) | INTRAVENOUS | Status: DC
  Administered 2012-01-28 – 2012-01-30 (×5): 40 mg via INTRAVENOUS
  Filled 2012-01-28 (×6): qty 40

## 2012-01-28 MED ORDER — MIDAZOLAM HCL 10 MG/2ML IJ SOLN
INTRAMUSCULAR | Status: AC
  Filled 2012-01-28: qty 2

## 2012-01-28 MED ORDER — ACETAMINOPHEN 650 MG RE SUPP: 650.0000 mg | Freq: Four times a day (QID) | RECTAL | Status: DC | PRN

## 2012-01-28 MED ORDER — HYDROCODONE-ACETAMINOPHEN 5-325 MG PO TABS: 1.0000 | ORAL_TABLET | Freq: Four times a day (QID) | ORAL | Status: DC | PRN

## 2012-01-28 MED ORDER — GLYCOPYRROLATE 0.2 MG/ML IJ SOLN
INTRAMUSCULAR | Status: AC
  Filled 2012-01-28: qty 1

## 2012-01-28 MED ORDER — BUTAMBEN-TETRACAINE-BENZOCAINE 2-2-14 % EX AERO
INHALATION_SPRAY | CUTANEOUS | Status: DC | PRN
  Administered 2012-01-28: 2 via TOPICAL

## 2012-01-28 MED ORDER — PANTOPRAZOLE SODIUM 40 MG IV SOLR
40.0000 mg | Freq: Every day | INTRAVENOUS | Status: DC
  Administered 2012-01-28: 40 mg via INTRAVENOUS
  Filled 2012-01-28: qty 40

## 2012-01-28 MED ORDER — GLYCOPYRROLATE 0.2 MG/ML IJ SOLN
INTRAMUSCULAR | Status: DC | PRN
  Administered 2012-01-28: 0.2 mg via INTRAVENOUS

## 2012-01-28 MED ORDER — ACETAMINOPHEN 325 MG PO TABS
650.0000 mg | ORAL_TABLET | Freq: Four times a day (QID) | ORAL | Status: DC | PRN
  Administered 2012-01-29: 650 mg via ORAL
  Filled 2012-01-28: qty 2

## 2012-01-28 MED ORDER — ALPRAZOLAM 0.5 MG PO TABS
0.5000 mg | ORAL_TABLET | Freq: Every evening | ORAL | Status: DC | PRN
  Administered 2012-01-28: 0.5 mg via ORAL
  Filled 2012-01-28: qty 1

## 2012-01-28 MED ORDER — ONDANSETRON HCL 4 MG/2ML IJ SOLN: 4.0000 mg | Freq: Four times a day (QID) | INTRAMUSCULAR | Status: DC | PRN

## 2012-01-28 MED ORDER — SODIUM CHLORIDE 0.9 % IV SOLN
INTRAVENOUS | Status: AC
  Administered 2012-01-28: 01:00:00 via INTRAVENOUS

## 2012-01-28 MED ORDER — SODIUM CHLORIDE 0.9 % IV SOLN
INTRAVENOUS | Status: DC
  Administered 2012-01-28 (×2): via INTRAVENOUS

## 2012-01-28 MED ORDER — SODIUM CHLORIDE 0.9 % IJ SOLN
3.0000 mL | Freq: Two times a day (BID) | INTRAMUSCULAR | Status: DC
  Administered 2012-01-28 – 2012-01-30 (×4): 3 mL via INTRAVENOUS

## 2012-01-28 MED ORDER — MIDAZOLAM HCL 10 MG/2ML IJ SOLN
INTRAMUSCULAR | Status: DC | PRN
  Administered 2012-01-28: 2.5 mg via INTRAVENOUS

## 2012-01-28 NOTE — Consult Note (Addendum)
Patient was seen and examined.  History was reviewed.  76 year old white female several day history of melenic stools and burning chest discomfort. Symptoms since her hernia repair.   Physical exam is pertinent for ecchymosis in the upper abdominal wall and mild tenderness in the midepigastrium.  Impression -  #1 Subacute , probable upper GI in origin - r/o active PUD, stress gastritis. LGI bleeding source is less likely. #2 anemia - secondary to GI blood loss #3 chest pain - suspect GI in origin  Recommendations #1 protonix #2 EGD - today  Addendum note; upon review of the hospital chart I am unable to locate a full consult note. This was done by Mike Gip, PA and reviewed by myself.

## 2012-01-28 NOTE — Op Note (Signed)
[facility] [address] [place], [place]  [address]  ENDOSCOPY PROCEDURE REPORT  PATIENT:  Denise Hoffman, Denise Hoffman  MR#:  [id number] BIRTHDATE:  Feb 15, 1936, 75 yrs. old  GENDER:  female  ENDOSCOPIST:  Barbette Hair. Arlyce Dice, MD Referred by:  Catha Gosselin, M.D.  PROCEDURE DATE:  01/28/2012 PROCEDURE:  EGD, diagnostic 43235 ASA CLASS:  Class II INDICATIONS:  melena  MEDICATIONS:   These medications were titrated to patient response per physician's verbal order, Fentanyl 25 mcg IV, Versed 2 mg IV, glycopyrrolate (Robinal) 0.2 mg IV TOPICAL ANESTHETIC:  Cetacaine Spray  DESCRIPTION OF PROCEDURE:   After the risks and benefits of the procedure were explained, informed consent was obtained.  The Pentax Gastroscope [id number] endoscope was introduced through the mouth and advanced to the proximal jejunum.  The instrument was slowly withdrawn as the mucosa was fully examined. <<PROCEDUREIMAGES>>  The upper, middle, and distal third of the esophagus were carefully inspected and no abnormalities were noted. The z-line was well seen at the GEJ. The endoscope was pushed into the fundus which was normal including a retroflexed view. The antrum,gastric body, first and second part of the duodenum were unremarkable. normal postoperative changes with a pyloric sparing Whipple (see image001, image002, image003, image004, and image005). No fresh or old blood    Retroflexed views revealed no abnormalities.    The scope was then withdrawn from the patient and the procedure completed.  COMPLICATIONS:  None  ENDOSCOPIC IMPRESSION: 1) Normal EGD RECOMMENDATIONS: 1) Proceed with a Colonscopy.  ______________________________ Barbette Hair. Arlyce Dice, MD  CC:  n. eSIGNED:   Barbette Hair. Mieke Brinley at 01/28/2012 03:29 PM  Vito Berger, [id number]

## 2012-01-28 NOTE — H&P (Signed)
Denise Hoffman is an 76 y.o. female.   PCP - Dr.Kevin, Little. Chief Complaint: Chest pressure. HPI: 76 year-old female who was just recently discharged from the hospital 2 days ago after being observed for near-syncopal episode has come back to the ER at the [facility] with complaints of chest pressure and having passed black stools last 3-4 days. Patient states the chest pressure is retrosternal radiating to the back. Denies any associated shortness of breath. Denies any nausea vomiting abdominal pain or diarrhea. In the ER patient had CT angiogram of the chest which was negative for any pulmonary embolism. Stool for occult blood was positive and patient will be admitted for further observation. Patient denies using any NSAIDs. Patient states she has had recent ventral hernia repair last week on Thursday at [facility]. Since then she has been feeling weak. She has had history of Whipple's procedure done for pancreatic cyst.  Past Medical History  Diagnosis Date  . Pancreatic cyst   . Hernia   . Hypertension     Past Surgical History  Procedure Date  . Stomach surgery   . Hernia repair     left hernia    Family History  Problem Relation Age of Onset  . Breast cancer Sister   . Stroke Mother   . Colon cancer Brother    Social History:  reports that she has never smoked. She has never used smokeless tobacco. She reports that she does not drink alcohol or use illicit drugs.  Allergies:  Allergies  Allergen Reactions  . ZOX:[id number]+XBJYNWGNF+AOZHYQMVHQ Acid+Aspartame   . Morphine   . Prednisone   . Sulfonamide Derivatives     Medications Prior to Admission  Medication Sig Dispense Refill  . aspirin 81 MG tablet Take 81 mg by mouth daily.      . calcium citrate-vitamin D (CITRACAL+D) 315-200 MG-UNIT per tablet Take 1 tablet by mouth 2 (two) times daily.      . cholecalciferol (VITAMIN D) 1000 UNITS tablet Take 1,000 Units by mouth daily.      .  hydrochlorothiazide (HYDRODIURIL) 25 MG tablet Take 25 mg by mouth daily.      Marland Kitchen HYDROcodone-acetaminophen (NORCO) 5-325 MG per tablet Take 1 tablet by mouth every 6 (six) hours as needed. Patient used this medication for pain.      Marland Kitchen losartan (COZAAR) 50 MG tablet Take 50 mg by mouth daily.      Marland Kitchen lubiprostone (AMITIZA) 24 MCG capsule Take 24 mcg by mouth daily with breakfast.      . metoprolol (LOPRESSOR) 100 MG tablet Take 100 mg by mouth 2 (two) times daily.      Marland Kitchen ALPRAZolam (XANAX) 0.5 MG tablet Take 0.5 mg by mouth at bedtime as needed.      . lidocaine (LIDODERM) 5 % Place 1 patch onto the skin daily. Remove & Discard patch within 12 hours or as directed by MD      . oxyCODONE (OXY IR/ROXICODONE) 5 MG immediate release tablet Take 5-10 mg by mouth every 4 (four) hours as needed. Post surgery pain        Results for orders placed during the hospital encounter of 01/27/12 (from the past 48 hour(s))  CBC     Status: Abnormal   Collection Time   01/27/12 10:22 PM      Component Value Range Comment   WBC 10.0  4.0 - 10.5 (K/uL)    RBC 3.10 (*) 3.87 - 5.11 (MIL/uL)  Hemoglobin 9.2 (*) 12.0 - 15.0 (g/dL)    HCT 16.1 (*) 09.6 - 46.0 (%)    MCV 88.7  78.0 - 100.0 (fL)    MCH 29.7  26.0 - 34.0 (pg)    MCHC 33.5  30.0 - 36.0 (g/dL)    RDW 04.5  40.9 - 81.1 (%)    Platelets 229  150 - 400 (K/uL)   DIFFERENTIAL     Status: Abnormal   Collection Time   01/27/12 10:22 PM      Component Value Range Comment   Neutrophils Relative 65  43 - 77 (%)    Neutro Abs 6.5  1.7 - 7.7 (K/uL)    Lymphocytes Relative 23  12 - 46 (%)    Lymphs Abs 2.3  0.7 - 4.0 (K/uL)    Monocytes Relative 11  3 - 12 (%)    Monocytes Absolute 1.1 (*) 0.1 - 1.0 (K/uL)    Eosinophils Relative 2  0 - 5 (%)    Eosinophils Absolute 0.2  0.0 - 0.7 (K/uL)    Basophils Relative 0  0 - 1 (%)    Basophils Absolute 0.0  0.0 - 0.1 (K/uL)   COMPREHENSIVE METABOLIC PANEL     Status: Abnormal   Collection Time   01/27/12 10:22 PM        Component Value Range Comment   Sodium 134 (*) 135 - 145 (mEq/L)    Potassium 3.4 (*) 3.5 - 5.1 (mEq/L)    Chloride 96  96 - 112 (mEq/L)    CO2 28  19 - 32 (mEq/L)    Glucose, Bld 127 (*) 70 - 99 (mg/dL)    BUN 17  6 - 23 (mg/dL)    Creatinine, Ser 9.14  0.50 - 1.10 (mg/dL)    Calcium 9.5  8.4 - 10.5 (mg/dL)    Total Protein 6.4  6.0 - 8.3 (g/dL)    Albumin 3.5  3.5 - 5.2 (g/dL)    AST 17  0 - 37 (U/L)    ALT 16  0 - 35 (U/L)    Alkaline Phosphatase 91  39 - 117 (U/L)    Total Bilirubin 0.2 (*) 0.3 - 1.2 (mg/dL)    GFR calc non Af Amer 62 (*) >90 (mL/min)    GFR calc Af Amer 72 (*) >90 (mL/min)   CARDIAC PANEL(CRET KIN+CKTOT+MB+TROPI)     Status: Normal   Collection Time   01/27/12 10:22 PM      Component Value Range Comment   Total CK 63  7 - 177 (U/L)    CK, MB 2.3  0.3 - 4.0 (ng/mL)    Troponin I <0.30  <0.30 (ng/mL)    Relative Index RELATIVE INDEX IS INVALID  0.0 - 2.5    D-DIMER, QUANTITATIVE     Status: Abnormal   Collection Time   01/27/12 10:22 PM      Component Value Range Comment   D-Dimer, Quant 0.70 (*) 0.00 - 0.48 (ug/mL-FEU)   OCCULT BLOOD X 1 CARD TO LAB, STOOL     Status: Normal   Collection Time   01/27/12 11:27 PM      Component Value Range Comment   Fecal Occult Bld POSITIVE      Dg Chest 2 View  01/27/2012  *RADIOLOGY REPORT*  Clinical Data: Mid chest pressure.  CHEST - 2 VIEW  Comparison: 12/22/2011 at [facility].  Findings: Normal heart size and pulmonary vascularity. Emphysematous changes and fibrosis in the lungs.  New areas of linear atelectasis in the mid lungs.  No focal airspace consolidation is appreciated.  No blunting of costophrenic angles. No pneumothorax.  Tortuous aorta.  Mild degenerative changes in the spine.  Surgical clips in the right upper quadrant.  IMPRESSION: No evidence of active pulmonary disease.  Interval development of linear atelectasis in the mid lungs.  Original Report Authenticated By: Marlon Pel, M.D.    Ct Angio Chest W/cm &/or Wo Cm  01/28/2012  *RADIOLOGY REPORT*  Clinical Data: Shortness of breath.  Chest pain and pressure. Recent laparoscopic hernia repair.  CT ANGIOGRAPHY CHEST  Technique:  Multidetector CT imaging of the chest using the standard protocol during bolus administration of intravenous contrast. Multiplanar reconstructed images including MIPs were obtained and reviewed to evaluate the vascular anatomy.  Contrast: 80mL OMNIPAQUE IOHEXOL 350 MG/ML SOLN  Comparison: None.  Findings: Technically adequate study with good opacification of the central and segmental pulmonary arteries.  No focal filling defects demonstrated.  No evidence of significant pulmonary embolus. Atelectasis or consolidation in the lung bases posteriorly. Diffuse emphysematous changes in the lungs.  No pneumothorax.  No pleural effusions.  Mild cardiac enlargement.  Coronary artery calcifications.  Normal caliber thoracic aorta with mild calcification.  No significant lymphadenopathy in the chest.  The esophagus is decompressed.  Airways appear patent.  Degenerative changes in the thoracic spine with normal alignment.  Images obtained incidentally of the upper abdomen demonstrate gas in the biliary tract likely due to prior sphincterotomy.  Correlation with surgical history is recommended.  IMPRESSION: No evidence of significant pulmonary embolus.  Atelectasis or focal consolidation in the lung bases.  Original Report Authenticated By: Marlon Pel, M.D.    Review of Systems  HENT: Negative.   Eyes: Negative.   Respiratory: Negative.   Cardiovascular: Positive for chest pain.  Gastrointestinal: Positive for melena.  Genitourinary: Negative.   Musculoskeletal: Negative.   Skin: Negative.   Neurological: Positive for weakness.  Endo/Heme/Allergies: Negative.   Psychiatric/Behavioral: Negative.     Blood pressure 116/67, pulse 69, temperature 97.8 F (36.6 C), temperature source Oral, resp. rate 18, height  5\' 4"  (1.626 m), weight 64.864 kg (143 lb), SpO2 100.00%. Physical Exam  Constitutional: She is oriented to person, place, and time. She appears well-developed and well-nourished. No distress.  HENT:  Head: Normocephalic and atraumatic.  Right Ear: External ear normal.  Left Ear: External ear normal.  Mouth/Throat: No oropharyngeal exudate.  Eyes: Conjunctivae are normal. Pupils are equal, round, and reactive to light. Right eye exhibits no discharge. Left eye exhibits no discharge. No scleral icterus.  Neck: Normal range of motion. Neck supple.  Cardiovascular: Normal rate and regular rhythm.   Respiratory: Effort normal and breath sounds normal. No respiratory distress. She has no wheezes. She has no rales.  GI: Soft. Bowel sounds are normal. She exhibits no distension. There is no tenderness. There is no rebound.       Bruise on the belly.  Musculoskeletal: Normal range of motion. She exhibits no edema and no tenderness.  Neurological: She is alert and oriented to person, place, and time.       Moves all extremities.  Skin: Skin is warm and dry. No rash noted. She is not diaphoretic. No erythema.  Psychiatric: Her behavior is normal.     Assessment/Plan #1. Chest pressure at this time we will rule out ACS - EKG does not show anything acute. We will cycle cardiac markers and also check lipase levels. #2. GI  bleed possibly upper GI - recheck CBC now and after 6 hours. Transfuse as needed. I have ordered type and screen and hold. Patient will be on Protonix IV. I have consulted the [facility]. Patient does have bruise on the abdomen. If these bruises worsen or if patient gets abdominal pain or hypotensive need to get CT abdomen and pelvis to rule out any intra-abdominal hematoma. Check PT/INR. #3. History of hypertension presently patient is not taking antihypertensives due to low blood pressure since her ventral hernia repair. #4. History of Whipple's surgery for pancreatic cyst. #5.  Anemia secondary to #2 reason.  CODE STATUS - full code.  Eduard Clos 01/28/2012, 6:57 AM

## 2012-01-28 NOTE — Interval H&P Note (Signed)
History and Physical Interval Note:  01/28/2012 3:12 PM  Denise Hoffman  has presented today for surgery, with the diagnosis of melena  The various methods of treatment have been discussed with the patient and family. After consideration of risks, benefits and other options for treatment, the patient has consented to  Procedure(s) (LRB): ESOPHAGOGASTRODUODENOSCOPY (EGD) (N/A) as a surgical intervention .  The patients' history has been reviewed, patient examined, no change in status, stable for surgery.  I have reviewed the patients' chart and labs.  Questions were answered to the patient's satisfaction.     The recent H&P (dated *01/27/12**) was reviewed, the patient was examined and there is no change in the patients condition since that H&P was completed.   [name]  01/28/2012, 3:12 PM   [name]

## 2012-01-28 NOTE — Progress Notes (Signed)
Subjective: Complaining of epigastric discomfort and chest pressure; no SOB and no fever.  Objective: Vital signs in last 24 hours: Temp:  [97.8 F (36.6 C)-98.6 F (37 C)] 98.1 F (36.7 C) (04/25 1405) Pulse Rate:  [69-96] 70  (04/25 1405) Resp:  [15-20] 20  (04/25 1405) BP: (104-140)/(55-67) 128/56 mmHg (04/25 1405) SpO2:  [95 %-100 %] 100 % (04/25 1405) Weight:  [64.864 kg (143 lb)] 64.864 kg (143 lb) (04/25 0250) Weight change:  Last BM Date: 01/27/12  Intake/Output from previous day:   Total I/O In: 421.2 [I.V.:421.2] Out: -    Physical Exam: General: Alert, awake, oriented x3, in no acute distress. HEENT: No bruits, no goiter. Heart: Regular rate and rhythm, without murmurs, rubs, gallops. Lungs: Clear to auscultation bilaterally. Abdomen: Soft, tender to palpation in epigastric area; Nondistended, positive bowel sounds. Bruises across abdomen from Lap surgery Extremities: No clubbing, cyanosis or edema with positive pedal pulses. Neuro: Grossly intact, nonfocal.  Lab Results: Basic Metabolic Panel:  Basename 01/28/12 0755 01/27/12 2222  NA 136 134*  K 3.5 3.4*  CL 102 96  CO2 29 28  GLUCOSE 105* 127*  BUN 12 17  CREATININE 0.77 0.90  CALCIUM 9.0 9.5  MG -- --  PHOS -- --   Liver Function Tests:  [facility] 01/28/12 0755 01/27/12 2222  AST 15 17  ALT 13 16  ALKPHOS 80 91  BILITOT 0.3 0.2*  PROT 5.9* 6.4  ALBUMIN 3.1* 3.5    Basename 01/28/12 0755  LIPASE 16  AMYLASE --   CBC:  Basename 01/28/12 0755 01/27/12 2222  WBC 7.8 10.0  NEUTROABS 5.3 6.5  HGB 8.9* 9.2*  HCT 26.7* 27.5*  MCV 89.3 88.7  PLT 227 229   Cardiac Enzymes:  Basename 01/28/12 0755 01/27/12 2222  CKTOTAL 58 63  CKMB 2.5 2.3  CKMBINDEX -- --  TROPONINI <0.30 <0.30   D-Dimer:  Basename 01/27/12 2222  DDIMER 0.70*   Coagulation:  Basename 01/28/12 0755  LABPROT 12.9  INR 0.95    Studies/Results: Dg Chest 2 View  01/27/2012  *RADIOLOGY REPORT*  Clinical  Data: Mid chest pressure.  CHEST - 2 VIEW  Comparison: 12/22/2011 at [facility].  Findings: Normal heart size and pulmonary vascularity. Emphysematous changes and fibrosis in the lungs.  New areas of linear atelectasis in the mid lungs.  No focal airspace consolidation is appreciated.  No blunting of costophrenic angles. No pneumothorax.  Tortuous aorta.  Mild degenerative changes in the spine.  Surgical clips in the right upper quadrant.  IMPRESSION: No evidence of active pulmonary disease.  Interval development of linear atelectasis in the mid lungs.  Original Report Authenticated By: Marlon Pel, M.D.   Ct Angio Chest W/cm &/or Wo Cm  01/28/2012  *RADIOLOGY REPORT*  Clinical Data: Shortness of breath.  Chest pain and pressure. Recent laparoscopic hernia repair.  CT ANGIOGRAPHY CHEST  Technique:  Multidetector CT imaging of the chest using the standard protocol during bolus administration of intravenous contrast. Multiplanar reconstructed images including MIPs were obtained and reviewed to evaluate the vascular anatomy.  Contrast: 80mL OMNIPAQUE IOHEXOL 350 MG/ML SOLN  Comparison: None.  Findings: Technically adequate study with good opacification of the central and segmental pulmonary arteries.  No focal filling defects demonstrated.  No evidence of significant pulmonary embolus. Atelectasis or consolidation in the lung bases posteriorly. Diffuse emphysematous changes in the lungs.  No pneumothorax.  No pleural effusions.  Mild cardiac enlargement.  Coronary artery calcifications.  Normal caliber thoracic  aorta with mild calcification.  No significant lymphadenopathy in the chest.  The esophagus is decompressed.  Airways appear patent.  Degenerative changes in the thoracic spine with normal alignment.  Images obtained incidentally of the upper abdomen demonstrate gas in the biliary tract likely due to prior sphincterotomy.  Correlation with surgical history is recommended.  IMPRESSION: No  evidence of significant pulmonary embolus.  Atelectasis or focal consolidation in the lung bases.  Original Report Authenticated By: Marlon Pel, M.D.    Medications: Scheduled Meds:   . sodium chloride   Intravenous STAT  . cholecalciferol  1,000 Units Oral Daily  . lidocaine  1 patch Transdermal Q0600  . pantoprazole (PROTONIX) IV  40 mg Intravenous Once  . pantoprazole (PROTONIX) IV  40 mg Intravenous Q12H  . sodium chloride  3 mL Intravenous Q12H  . DISCONTD: pantoprazole (PROTONIX) IV  40 mg Intravenous Daily   Continuous Infusions:   . sodium chloride 75 mL/hr at 01/28/12 0635   PRN Meds:.acetaminophen, acetaminophen, ALPRAZolam, HYDROcodone-acetaminophen, iohexol, ondansetron (ZOFRAN) IV, ondansetron  Assessment/Plan: 1-Chest pressure: most likely GI in nature as source and cause; will cycle cardiac enzymes and will continue telemetry for now. EGD by GI service today.  2-Anemia: acute blood loss. Will transfuse if less than 8; EGD this afternoon. Continue protonix.  3-GI bleeding: upper GI in etiology most likely; EGD this PM and will continue IV protonix. Will transfuse if Hgb less than 8.  4-HTN (hypertension): no receiving any medications for BP; will continue holding antihypertensive drugs for now (especially in settings of acute bleeding).  5-DVT: SCD's    LOS: 1 day   Taym Twist [facility] [phone]  01/28/2012, 2:31 PM

## 2012-01-29 ENCOUNTER — Inpatient Hospital Stay (HOSPITAL_COMMUNITY): Payer: Medicare Other

## 2012-01-29 ENCOUNTER — Encounter (HOSPITAL_COMMUNITY): Payer: Self-pay | Admitting: Gastroenterology

## 2012-01-29 ENCOUNTER — Encounter (HOSPITAL_COMMUNITY)
Admission: EM | Disposition: A | Discharge status: Home / Self Care | Payer: Self-pay | Source: Home / Self Care | Attending: Internal Medicine

## 2012-01-29 DIAGNOSIS — R112 Nausea with vomiting, unspecified: Secondary | ICD-10-CM | POA: Diagnosis not present

## 2012-01-29 DIAGNOSIS — K439 Ventral hernia without obstruction or gangrene: Secondary | ICD-10-CM | POA: Diagnosis not present

## 2012-01-29 DIAGNOSIS — K3189 Other diseases of stomach and duodenum: Secondary | ICD-10-CM | POA: Diagnosis not present

## 2012-01-29 DIAGNOSIS — I10 Essential (primary) hypertension: Secondary | ICD-10-CM | POA: Diagnosis not present

## 2012-01-29 DIAGNOSIS — R1013 Epigastric pain: Secondary | ICD-10-CM | POA: Diagnosis not present

## 2012-01-29 DIAGNOSIS — R079 Chest pain, unspecified: Secondary | ICD-10-CM | POA: Diagnosis not present

## 2012-01-29 DIAGNOSIS — K5731 Diverticulosis of large intestine without perforation or abscess with bleeding: Secondary | ICD-10-CM | POA: Diagnosis not present

## 2012-01-29 DIAGNOSIS — D649 Anemia, unspecified: Secondary | ICD-10-CM | POA: Diagnosis not present

## 2012-01-29 DIAGNOSIS — K922 Gastrointestinal hemorrhage, unspecified: Secondary | ICD-10-CM | POA: Diagnosis not present

## 2012-01-29 LAB — CBC
HCT: 27 % — ABNORMAL LOW (ref 36.0–46.0)
Hemoglobin: 8.9 g/dL — ABNORMAL LOW (ref 12.0–15.0)
MCH: 29.5 pg (ref 26.0–34.0)
MCHC: 33 g/dL (ref 30.0–36.0)
MCV: 89.4 fL (ref 78.0–100.0)
Platelets: 233 10*3/uL (ref 150–400)
RBC: 3.02 MIL/uL — ABNORMAL LOW (ref 3.87–5.11)
RDW: 14.5 % (ref 11.5–15.5)
WBC: 9.1 10*3/uL (ref 4.0–10.5)

## 2012-01-29 SURGERY — COLONOSCOPY
Anesthesia: Moderate Sedation

## 2012-01-29 MED ORDER — IOHEXOL 300 MG/ML  SOLN
100.0000 mL | Freq: Once | INTRAMUSCULAR | Status: AC | PRN
  Administered 2012-01-29: 80 mL via INTRAVENOUS

## 2012-01-29 NOTE — Progress Notes (Signed)
Patient ID: Denise Hoffman, female   DOB: [birth year], 76 y.o.   MRN: [id number] [facility] Progress Note  Subjective: Sore from hernia repair , otherwise no pain. Still having black stools-3 BM's past 24 hours  Objective:  Vital signs in last 24 hours: Temp:  [97.7 F (36.5 C)-98.2 F (36.8 C)] 98.2 F (36.8 C) (04/26 0552) Pulse Rate:  [70-83] 76  (04/26 0552) Resp:  [11-23] 18  (04/26 0552) BP: (120-150)/(56-86) 122/73 mmHg (04/26 0552) SpO2:  [95 %-100 %] 95 % (04/26 0552) Last BM Date: 01/27/12 General:   Alert,  Well-developed,    in NAD,pale Heart:  Regular rate and rhythm; no murmurs Pulm;clear Abdomen:  Soft, tender upper abdomen, bruised , nondistended. Normal bowel sounds, without guarding, and without rebound.   Extremities:  Without edema. Neurologic:  Alert and  oriented x4;  grossly normal neurologically. Psych:  Alert and cooperative. Normal mood and affect.  Intake/Output from previous day: 04/25 0701 - 04/26 0700 In: 971.7 [I.V.:971.7] Out: -  Intake/Output this shift: Total I/O In: 600 [P.O.:600] Out: -   Lab Results:  Basename 01/29/12 0510 01/28/12 0755 01/27/12 2222  WBC 9.1 7.8 10.0  HGB 8.9* 8.9* 9.2*  HCT 27.0* 26.7* 27.5*  PLT 233 227 229   BMET  Basename 01/28/12 0755 01/27/12 2222  NA 136 134*  K 3.5 3.4*  CL 102 96  CO2 29 28  GLUCOSE 105* 127*  BUN 12 17  CREATININE 0.77 0.90  CALCIUM 9.0 9.5   LFT  Basename 01/28/12 0755  PROT 5.9*  ALBUMIN 3.1*  AST 15  ALT 13  ALKPHOS 80  BILITOT 0.3  BILIDIR --  IBILI --   PT/INR  Basename 01/28/12 0755  LABPROT 12.9  INR 0.95      Assessment / Plan: #1 76 yo female s/p ventral hernia repair earlier this week, and S/P whipple 2011. #2 syncope day of admit felt secondary to blood loss #3 anemia,acute blood loss- hgb stbale past 24 hours #4 melena- EGD negative, plan is for colonoscopy tomorrow, will discuss CT abdomen  As bleeding occurred within 24 hours  of her surgery ?bled from small bowel.  Principal Problem:  *Chest pressure Active Problems:  Anemia  GI bleeding  HTN (hypertension)     LOS: 2 days   Naasia Weilbacher  01/29/2012, 9:01 AM

## 2012-01-29 NOTE — Progress Notes (Signed)
Awaiting results of CT.  If negative will proceed with colonoscopy.

## 2012-01-29 NOTE — Progress Notes (Signed)
Subjective: Still with some epigastric soreness (associated with hernia repair surgery);  Reports her chest pressure is gone. Still with black stools (approx. 3 in the lst 36 hours).  Objective: Vital signs in last 24 hours: Temp:  [97.7 F (36.5 C)-98.2 F (36.8 C)] 98.2 F (36.8 C) (04/26 0552) Pulse Rate:  [70-83] 76  (04/26 0552) Resp:  [11-23] 18  (04/26 0552) BP: (120-150)/(56-86) 122/73 mmHg (04/26 0552) SpO2:  [95 %-100 %] 95 % (04/26 0552) Weight change:  Last BM Date: 01/28/12  Intake/Output from previous day: 04/25 0701 - 04/26 0700 In: 971.7 [I.V.:971.7] Out: -  Total I/O In: 600 [P.O.:600] Out: -    Physical Exam: General: Alert, awake, oriented x3, in no acute distress. HEENT: No bruits, no goiter. Heart: Regular rate and rhythm, without murmurs, rubs, gallops. Lungs: Clear to auscultation bilaterally. Abdomen: Soft, tender to palpation in epigastric area; Nondistended, positive bowel sounds. Bruises across abdomen from Lap surgery Extremities: No clubbing, cyanosis or edema with positive pedal pulses. Neuro: Grossly intact, nonfocal.  Lab Results: Basic Metabolic Panel:  Basename 01/28/12 0755 01/27/12 2222  NA 136 134*  K 3.5 3.4*  CL 102 96  CO2 29 28  GLUCOSE 105* 127*  BUN 12 17  CREATININE 0.77 0.90  CALCIUM 9.0 9.5  MG -- --  PHOS -- --   Liver Function Tests:  [facility] 01/28/12 0755 01/27/12 2222  AST 15 17  ALT 13 16  ALKPHOS 80 91  BILITOT 0.3 0.2*  PROT 5.9* 6.4  ALBUMIN 3.1* 3.5    Basename 01/28/12 0755  LIPASE 16  AMYLASE --   CBC:  Basename 01/29/12 0510 01/28/12 0755 01/27/12 2222  WBC 9.1 7.8 --  NEUTROABS -- 5.3 6.5  HGB 8.9* 8.9* --  HCT 27.0* 26.7* --  MCV 89.4 89.3 --  PLT 233 227 --   Cardiac Enzymes:  Basename 01/28/12 2300 01/28/12 1545 01/28/12 0755  CKTOTAL 57 54 58  CKMB 2.2 2.4 2.5  CKMBINDEX -- -- --  TROPONINI <0.30 <0.30 <0.30   D-Dimer:  Basename 01/27/12 2222  DDIMER 0.70*    Coagulation:  Basename 01/28/12 0755  LABPROT 12.9  INR 0.95    Studies/Results: Dg Chest 2 View  01/27/2012  *RADIOLOGY REPORT*  Clinical Data: Mid chest pressure.  CHEST - 2 VIEW  Comparison: 12/22/2011 at [facility]  Findings: Normal heart size and pulmonary vascularity. Emphysematous changes and fibrosis in the lungs.  New areas of linear atelectasis in the mid lungs.  No focal airspace consolidation is appreciated.  No blunting of costophrenic angles. No pneumothorax.  Tortuous aorta.  Mild degenerative changes in the spine.  Surgical clips in the right upper quadrant.  IMPRESSION: No evidence of active pulmonary disease.  Interval development of linear atelectasis in the mid lungs.  Original Report Authenticated By: Marlon Pel, M.D.   Ct Angio Chest W/cm &/or Wo Cm  01/28/2012  *RADIOLOGY REPORT*  Clinical Data: Shortness of breath.  Chest pain and pressure. Recent laparoscopic hernia repair.  CT ANGIOGRAPHY CHEST  Technique:  Multidetector CT imaging of the chest using the standard protocol during bolus administration of intravenous contrast. Multiplanar reconstructed images including MIPs were obtained and reviewed to evaluate the vascular anatomy.  Contrast: 80mL OMNIPAQUE IOHEXOL 350 MG/ML SOLN  Comparison: None.  Findings: Technically adequate study with good opacification of the central and segmental pulmonary arteries.  No focal filling defects demonstrated.  No evidence of significant pulmonary embolus. Atelectasis or consolidation in the lung  bases posteriorly. Diffuse emphysematous changes in the lungs.  No pneumothorax.  No pleural effusions.  Mild cardiac enlargement.  Coronary artery calcifications.  Normal caliber thoracic aorta with mild calcification.  No significant lymphadenopathy in the chest.  The esophagus is decompressed.  Airways appear patent.  Degenerative changes in the thoracic spine with normal alignment.  Images obtained incidentally of the upper  abdomen demonstrate gas in the biliary tract likely due to prior sphincterotomy.  Correlation with surgical history is recommended.  IMPRESSION: No evidence of significant pulmonary embolus.  Atelectasis or focal consolidation in the lung bases.  Original Report Authenticated By: Marlon Pel, M.D.    Medications: Scheduled Meds:    . sodium chloride   Intravenous STAT  . cholecalciferol  1,000 Units Oral Daily  . lidocaine  1 patch Transdermal Q0600  . pantoprazole (PROTONIX) IV  40 mg Intravenous Q12H  . peg 3350 powder  1 kit Oral Once  . sodium chloride  3 mL Intravenous Q12H   Continuous Infusions:    . sodium chloride 75 mL/hr at 01/28/12 2339  . DISCONTD: sodium chloride 500 mL (01/28/12 1438)   PRN Meds:.acetaminophen, acetaminophen, ALPRAZolam, HYDROcodone-acetaminophen, ondansetron (ZOFRAN) IV, ondansetron, DISCONTD: butamben-tetracaine-benzocaine, DISCONTD: fentaNYL, DISCONTD: glycopyrrolate, DISCONTD: midazolam  Assessment/Plan: 1-Chest pressure: most likely was related with recent hernia repair. EGD essentially normal; and CE'z and tele/EKG WNL. Will continue PPI.  2-Anemia: acute blood loss; Hgb 8.9. Will transfuse if less than 8; colonoscopy tomorrow. Continue protonix.  3-GI bleeding: upper GI source practically r/o with EGD; at this point will have colonoscopy in am. Still with black stool; Hgb stable. ?? Small bowel AVM as source. Will follow GI recommendations.  4-HTN (hypertension): no receiving any medications for BP; will continue holding antihypertensive drugs for now (especially in settings of acute bleeding).  5-Hypokalemia: repleted  6-DVT: SCD's    LOS: 2 days   Lonza Shimabukuro [facility] [phone]  01/29/2012, 12:44 PM

## 2012-01-29 NOTE — Progress Notes (Signed)
Report of CT abdomen/pelvis called to Dr Arlyce Dice, plan is to proceed with bowel prep and colonoscopy and possible enteroscopy tomorrow. Patient aware.

## 2012-01-30 ENCOUNTER — Encounter (HOSPITAL_COMMUNITY)
Admission: EM | Disposition: A | Discharge status: Home / Self Care | Payer: Self-pay | Source: Home / Self Care | Attending: Internal Medicine

## 2012-01-30 ENCOUNTER — Encounter (HOSPITAL_COMMUNITY): Payer: Self-pay | Admitting: *Deleted

## 2012-01-30 DIAGNOSIS — K5731 Diverticulosis of large intestine without perforation or abscess with bleeding: Principal | ICD-10-CM | POA: Diagnosis present

## 2012-01-30 DIAGNOSIS — R079 Chest pain, unspecified: Secondary | ICD-10-CM | POA: Diagnosis not present

## 2012-01-30 DIAGNOSIS — I10 Essential (primary) hypertension: Secondary | ICD-10-CM | POA: Diagnosis not present

## 2012-01-30 DIAGNOSIS — R112 Nausea with vomiting, unspecified: Secondary | ICD-10-CM | POA: Diagnosis not present

## 2012-01-30 DIAGNOSIS — K922 Gastrointestinal hemorrhage, unspecified: Secondary | ICD-10-CM | POA: Diagnosis not present

## 2012-01-30 HISTORY — PX: COLONOSCOPY: SHX5424

## 2012-01-30 LAB — BASIC METABOLIC PANEL
BUN: 4 mg/dL — ABNORMAL LOW (ref 6–23)
CO2: 25 mEq/L (ref 19–32)
Calcium: 8.8 mg/dL (ref 8.4–10.5)
Chloride: 105 mEq/L (ref 96–112)
Creatinine, Ser: 0.64 mg/dL (ref 0.50–1.10)
GFR calc Af Amer: >90 mL/min (ref >90)
GFR calc non Af Amer: 86 mL/min — ABNORMAL LOW (ref >90)
Glucose, Bld: 95 mg/dL (ref 70–99)
Potassium: 3.3 mEq/L — ABNORMAL LOW (ref 3.5–5.1)
Sodium: 138 mEq/L (ref 135–145)

## 2012-01-30 LAB — CBC
HCT: 28 % — ABNORMAL LOW (ref 36.0–46.0)
Hemoglobin: 9.3 g/dL — ABNORMAL LOW (ref 12.0–15.0)
MCH: 29.9 pg (ref 26.0–34.0)
MCHC: 33.2 g/dL (ref 30.0–36.0)
MCV: 90 fL (ref 78.0–100.0)
Platelets: 242 10*3/uL (ref 150–400)
RBC: 3.11 MIL/uL — ABNORMAL LOW (ref 3.87–5.11)
RDW: 14.5 % (ref 11.5–15.5)
WBC: 6.1 10*3/uL (ref 4.0–10.5)

## 2012-01-30 LAB — GLUCOSE, CAPILLARY: Glucose-Capillary: 95 mg/dL (ref 70–99)

## 2012-01-30 SURGERY — COLONOSCOPY
Anesthesia: Moderate Sedation

## 2012-01-30 MED ORDER — FENTANYL NICU IV SYRINGE 50 MCG/ML
INJECTION | INTRAMUSCULAR | Status: DC | PRN
  Administered 2012-01-30: 25 ug via INTRAVENOUS
  Administered 2012-01-30 (×2): 12.5 ug via INTRAVENOUS
  Administered 2012-01-30: 25 ug via INTRAVENOUS

## 2012-01-30 MED ORDER — HYDROCODONE-ACETAMINOPHEN 5-325 MG PO TABS: 1.0000 | ORAL_TABLET | Freq: Three times a day (TID) | ORAL | Status: DC | PRN

## 2012-01-30 MED ORDER — MIDAZOLAM HCL 5 MG/5ML IJ SOLN
INTRAMUSCULAR | Status: DC | PRN
  Administered 2012-01-30 (×3): 2 mg via INTRAVENOUS

## 2012-01-30 MED ORDER — ASPIRIN 81 MG PO TABS: 81.0000 mg | ORAL_TABLET | Freq: Every day | ORAL | Status: DC

## 2012-01-30 MED ORDER — FERROUS GLUCONATE 216 MG PO TABS: 216.0000 mg | ORAL_TABLET | Freq: Two times a day (BID) | ORAL | Status: DC

## 2012-01-30 MED ORDER — METOPROLOL TARTRATE 100 MG PO TABS: 50.0000 mg | ORAL_TABLET | Freq: Two times a day (BID) | ORAL | Status: DC

## 2012-01-30 NOTE — Progress Notes (Signed)
Pt completed bowel prep and is ready for colonoscopy this AM.

## 2012-01-30 NOTE — Progress Notes (Signed)
After receiving, and signing for her d/c instructions and prescriptions; she is taken to her vehicle per w/c without incident.  She is awake, alert and in no distress; and is in no pain or discomfort.  Her husband is driving.

## 2012-01-30 NOTE — Discharge Summary (Signed)
Physician Discharge Summary  Patient ID: FEVEN ALDERFER MRN: [id number] DOB/AGE: 1936-04-05 76 y.o.  Admit date: 01/27/2012 Discharge date: 01/30/2012  Primary Care Physician:  Mickie Hillier, MD, MD   Discharge Diagnoses:   1-Chest pressure (non- cardiac; secondary to inflammation after hernia repair) 2-Anemia 3-GI bleeding 4-HTN (hypertension) 5-Diverticulosis of colon with hemorrhage   Medication List  As of 01/30/2012  4:36 PM   STOP taking these medications         hydrochlorothiazide 25 MG tablet      oxyCODONE 5 MG immediate release tablet         TAKE these medications         ALPRAZolam 0.5 MG tablet   Commonly known as: XANAX   Take 0.5 mg by mouth at bedtime as needed.      aspirin 81 MG tablet   Take 1 tablet (81 mg total) by mouth daily. -HOLD FOR ONE WEEK; THEN RESUMED 81MG  ENTERIC COATED DAILY ASPIRIN.      calcium citrate-vitamin D 315-200 MG-UNIT per tablet   Commonly known as: CITRACAL+D   Take 1 tablet by mouth 2 (two) times daily.      cholecalciferol 1000 UNITS tablet   Commonly known as: VITAMIN D   Take 1,000 Units by mouth daily.      ferrous gluconate 216 MG tablet   Commonly known as: FERGON   Take 1 tablet (216 mg total) by mouth 2 (two) times daily.      HYDROcodone-acetaminophen 5-325 MG per tablet   Commonly known as: NORCO   Take 1 tablet by mouth every 8 (eight) hours as needed. Patient used this medication for pain.      lidocaine 5 %   Commonly known as: LIDODERM   Place 1 patch onto the skin daily. Remove & Discard patch within 12 hours or as directed by MD      losartan 50 MG tablet   Commonly known as: COZAAR   Take 50 mg by mouth daily.      lubiprostone 24 MCG capsule   Commonly known as: AMITIZA   Take 24 mcg by mouth daily with breakfast.      metoprolol 100 MG tablet   Commonly known as: LOPRESSOR   Take 0.5 tablets (50 mg total) by mouth 2 (two) times daily.             Disposition and  Follow-up: Patient discharge in stable and improved condition. Currently no dizzy and w/o any CP or SOB. Patient found with diverticulosis (no actively bleeding). Hgb stable at discharge 9.3; she will follow with PCP in 1 week in order to have her CBC repeated and will also start taking iron and holding ASA for 1 week. If re-bleeding plan will be for bleeding scan and also capsule endoscopy to evaluate Small bowel.   Consults:   GI   Significant Diagnostic Studies:  Dg Chest 2 View  01/27/2012  *RADIOLOGY REPORT*  Clinical Data: Mid chest pressure.  CHEST - 2 VIEW  Comparison: 12/22/2011 at [facility].  Findings: Normal heart size and pulmonary vascularity. Emphysematous changes and fibrosis in the lungs.  New areas of linear atelectasis in the mid lungs.  No focal airspace consolidation is appreciated.  No blunting of costophrenic angles. No pneumothorax.  Tortuous aorta.  Mild degenerative changes in the spine.  Surgical clips in the right upper quadrant.  IMPRESSION: No evidence of active pulmonary disease.  Interval development of linear atelectasis in the mid lungs.  Original Report Authenticated By: Marlon Pel, M.D.   Ct Angio Chest W/cm &/or Wo Cm  01/28/2012  *RADIOLOGY REPORT*  Clinical Data: Shortness of breath.  Chest pain and pressure. Recent laparoscopic hernia repair.  CT ANGIOGRAPHY CHEST  Technique:  Multidetector CT imaging of the chest using the standard protocol during bolus administration of intravenous contrast. Multiplanar reconstructed images including MIPs were obtained and reviewed to evaluate the vascular anatomy.  Contrast: 80mL OMNIPAQUE IOHEXOL 350 MG/ML SOLN  Comparison: None.  Findings: Technically adequate study with good opacification of the central and segmental pulmonary arteries.  No focal filling defects demonstrated.  No evidence of significant pulmonary embolus. Atelectasis or consolidation in the lung bases posteriorly. Diffuse emphysematous  changes in the lungs.  No pneumothorax.  No pleural effusions.  Mild cardiac enlargement.  Coronary artery calcifications.  Normal caliber thoracic aorta with mild calcification.  No significant lymphadenopathy in the chest.  The esophagus is decompressed.  Airways appear patent.  Degenerative changes in the thoracic spine with normal alignment.  Images obtained incidentally of the upper abdomen demonstrate gas in the biliary tract likely due to prior sphincterotomy.  Correlation with surgical history is recommended.  IMPRESSION: No evidence of significant pulmonary embolus.  Atelectasis or focal consolidation in the lung bases.  Original Report Authenticated By: Marlon Pel, M.D.    Brief H and P: 76 year-old female who was just recently discharged from the hospital 2 days ago after being observed for near-syncopal episode has come back to the ER at the [facility] with complaints of chest pressure and having passed black stools last 3-4 days. Patient states the chest pressure is retrosternal radiating to the back. Denies any associated shortness of breath. Denies any nausea vomiting abdominal pain or diarrhea. In the ER patient had CT angiogram of the chest which was negative for any pulmonary embolism. Stool for occult blood was positive and patient will be admitted for further evaluation.   Hospital Course:  1-Chest pressure:appears to be secondary to inflammation process after hernia repair surgery; CE'z negative; normal recent 2-D echo and no abnormalities seen on EKG or telemetry during this admission.   2-Anemia: 2/2 acute blood loss anemia (diverticulosis). Hgb stable; will hold ASA for 1 week; start ferrous sulfate and follow a low residue diet.  3-GI bleeding  Due to diverticulosis of colon with hemorrhage: as above. EGD normal and colonoscopy demonstrating diverticulosis. No actively bleeding appreciated. Plan if rebleeding is for bleeding scan and capsule endoscopy to  evaluate Small Bowel.  4-HTN (hypertension):stable. Will resume home meds.   5-anxiety: continue alprazolam PRN.  The rest of medical problems remains stable and plan is to continue current medication regimen.    Time spent on Discharge: 40 minutes   Signed: Diezel Mazur 01/30/2012, 4:36 PM

## 2012-01-30 NOTE — Progress Notes (Signed)
Patient ID: Denise Hoffman, female   DOB: [birth year], 76 y.o.   MRN: [id number] [facility] Progress Note  Subjective: pt just down to endo for colonoscopy  Ct scan; inflammatory changes of gastric remnant, and around ventral hernia sac.  Hgb 9.3 which is stable   Objective:  Vital signs in last 24 hours: Temp:  [97.6 F (36.4 C)-98.3 F (36.8 C)] 98 F (36.7 C) (04/27 0700) Pulse Rate:  [75-84] 75  (04/27 0700) Resp:  [16-20] 16  (04/27 0826) BP: (144-181)/(69-86) 181/86 mmHg (04/27 0826) SpO2:  [96 %-98 %] 98 % (04/27 0826) Last BM Date: 01/30/12 General:    Intake/Output from previous day: 04/26 0701 - 04/27 0700 In: 840 [P.O.:840] Out: 2 [Urine:1; Stool:1] Intake/Output this shift:    Lab Results:  Basename 01/30/12 0620 01/29/12 0510 01/28/12 0755  WBC 6.1 9.1 7.8  HGB 9.3* 8.9* 8.9*  HCT 28.0* 27.0* 26.7*  PLT 242 233 227   BMET  Basename 01/30/12 0620 01/28/12 0755 01/27/12 2222  NA 138 136 134*  K 3.3* 3.5 3.4*  CL 105 102 96  CO2 25 29 28   GLUCOSE 95 105* 127*  BUN 4* 12 17  CREATININE 0.64 0.77 0.90  CALCIUM 8.8 9.0 9.5   LFT  Basename 01/28/12 0755  PROT 5.9*  ALBUMIN 3.1*  AST 15  ALT 13  ALKPHOS 80  BILITOT 0.3  BILIDIR --  IBILI --   PT/INR  Basename 01/28/12 0755  LABPROT 12.9  INR 0.95    Assessment / Plan: 76 yo female s/p whipple 2011 for benign disease, S/p ventral hernia repair one week ago, followed by acute Gi bleed of unclear etiology. Negative EGD, Ct scan does not reveal any findings to explain bleeding- pt for colonoscopy this am,await results. Bleeding hopefully is resolving-hg stable Principal Problem:  *Chest pressure Active Problems:  Anemia  GI bleeding  HTN (hypertension)     LOS: 3 days   Caria Transue  01/30/2012, 8:38 AM

## 2012-01-30 NOTE — Interval H&P Note (Signed)
History and Physical Interval Note:  01/30/2012 9:19 AM  Denise Hoffman  has presented today for surgery, with the diagnosis of gi bleed  The various methods of treatment have been discussed with the patient and family. After consideration of risks, benefits and other options for treatment, the patient has consented to  Procedure(s) (LRB): COLONOSCOPY (N/A) as a surgical intervention .  The patients' history has been reviewed, patient examined, no change in status, stable for surgery.  I have reviewed the patients' chart and labs.  Questions were answered to the patient's satisfaction.    The recent H&P (dated *01/29/12  **) was reviewed, the patient was examined and there is no change in the patients condition since that H&P was completed.   [name]  01/30/2012, 9:19 AM    [name]

## 2012-01-30 NOTE — Progress Notes (Signed)
Colonoscopy shows diffuse diverticulosis. I believe this is her source for GI bleeding.  Bleeding has stopped.  Recommendations #1 regular diet #2 STAT bleeding scan for recurrent bleeding

## 2012-01-30 NOTE — Progress Notes (Signed)
Returns from Endo at this time.  She is awake, drowsy and oriented x 4 with clear speech.  She is breathing normally.

## 2012-01-31 MED FILL — Diphenhydramine HCl Inj 50 MG/ML: INTRAMUSCULAR | Qty: 1 | Status: AC

## 2012-02-01 ENCOUNTER — Encounter (HOSPITAL_COMMUNITY): Payer: Self-pay | Admitting: Gastroenterology

## 2012-02-01 NOTE — Op Note (Signed)
[facility] [address] [place], [place]  [address]  COLONOSCOPY PROCEDURE REPORT  PATIENT:  Denise Hoffman, Denise Hoffman  MR#:  [id number] BIRTHDATE:  11/06/1935, 75 yrs. old  GENDER:  female ENDOSCOPIST:  Barbette Hair. Arlyce Dice, MD REF. BY:  Catha Gosselin, M.D. PROCEDURE DATE:  01/30/2012 PROCEDURE:  Diagnostic Colonoscopy ASA CLASS:  Class II INDICATIONS:  hematochezia MEDICATIONS:   These medications were titrated to patient response per physician's verbal order, Fentanyl 75 mcg IV, Versed 6 mg IV  DESCRIPTION OF PROCEDURE:   After the risks benefits and alternatives of the procedure were thoroughly explained, informed consent was obtained.  Digital rectal exam was performed and revealed no abnormalities.   The Pentax Colonoscope [id number] endoscope was introduced through the anus and advanced to the cecum, which was identified by both the appendix and ileocecal valve, without limitations.  The quality of the prep was excellent, using MoviPrep.  The instrument was then slowly withdrawn as the colon was fully examined. <<PROCEDUREIMAGES>>  FINDINGS:  Moderate diverticulosis was found (see image1). sigmoid to transverse colon  This was otherwise a normal examination of the colon (see image2 and image5).   Retroflexed views in the rectum revealed no abnormalities.    The time to cecum =  minutes. The scope was then withdrawn in  1) 6.0  minutes from the cecum and the procedure completed. COMPLICATIONS:  None ENDOSCOPIC IMPRESSION: 1) Moderate diverticulosis 2) Otherwise normal examination  GI bleeding is probably diverticular.  RECOMMENDATIONS:STAT bleeding scan for recurrent bleeding  REPEAT EXAM:  No  ______________________________ Barbette Hair. Arlyce Dice, MD  CC:  n. eSIGNED:   Barbette Hair. Navada Osterhout at 01/30/2012 09:45 AM  Vito Berger, [id number]

## 2012-02-02 ENCOUNTER — Ambulatory Visit: Payer: Medicare Other | Admitting: Gastroenterology

## 2012-02-04 DIAGNOSIS — K432 Incisional hernia without obstruction or gangrene: Secondary | ICD-10-CM | POA: Diagnosis not present

## 2012-02-05 DIAGNOSIS — I10 Essential (primary) hypertension: Secondary | ICD-10-CM | POA: Diagnosis not present

## 2012-02-05 DIAGNOSIS — E876 Hypokalemia: Secondary | ICD-10-CM | POA: Diagnosis not present

## 2012-02-05 DIAGNOSIS — K5731 Diverticulosis of large intestine without perforation or abscess with bleeding: Secondary | ICD-10-CM | POA: Diagnosis not present

## 2012-02-26 DIAGNOSIS — N8111 Cystocele, midline: Secondary | ICD-10-CM | POA: Diagnosis not present

## 2012-03-03 ENCOUNTER — Telehealth: Payer: Self-pay | Admitting: Gastroenterology

## 2012-03-03 ENCOUNTER — Other Ambulatory Visit (INDEPENDENT_AMBULATORY_CARE_PROVIDER_SITE_OTHER): Payer: Medicare Other

## 2012-03-03 DIAGNOSIS — R109 Unspecified abdominal pain: Secondary | ICD-10-CM | POA: Diagnosis not present

## 2012-03-03 DIAGNOSIS — R197 Diarrhea, unspecified: Secondary | ICD-10-CM | POA: Diagnosis not present

## 2012-03-03 LAB — CBC WITH DIFFERENTIAL/PLATELET
Basophils Absolute: 0 10*3/uL (ref 0.0–0.1)
Basophils Relative: 0.2 % (ref 0.0–3.0)
Eosinophils Absolute: 0.1 10*3/uL (ref 0.0–0.7)
Eosinophils Relative: 0.7 % (ref 0.0–5.0)
HCT: 35.3 % — ABNORMAL LOW (ref 36.0–46.0)
Hemoglobin: 11.4 g/dL — ABNORMAL LOW (ref 12.0–15.0)
Lymphocytes Relative: 14.6 % (ref 12.0–46.0)
Lymphs Abs: 1.8 10*3/uL (ref 0.7–4.0)
MCHC: 32.3 g/dL (ref 30.0–36.0)
MCV: 91.8 fl (ref 78.0–100.0)
Monocytes Absolute: 0.7 10*3/uL (ref 0.1–1.0)
Monocytes Relative: 5.3 % (ref 3.0–12.0)
Neutro Abs: 9.9 10*3/uL — ABNORMAL HIGH (ref 1.4–7.7)
Neutrophils Relative %: 79.2 % — ABNORMAL HIGH (ref 43.0–77.0)
Platelets: 246 10*3/uL (ref 150.0–400.0)
RBC: 3.84 Mil/uL — ABNORMAL LOW (ref 3.87–5.11)
RDW: 15.3 % — ABNORMAL HIGH (ref 11.5–14.6)
WBC: 12.5 10*3/uL — ABNORMAL HIGH (ref 4.5–10.5)

## 2012-03-03 LAB — BASIC METABOLIC PANEL
BUN: 13 mg/dL (ref 6–23)
CO2: 28 mEq/L (ref 19–32)
Calcium: 9.7 mg/dL (ref 8.4–10.5)
Chloride: 102 mEq/L (ref 96–112)
Creatinine, Ser: 0.8 mg/dL (ref 0.4–1.2)
GFR: 76.77 mL/min (ref >60.00)
Glucose, Bld: 100 mg/dL — ABNORMAL HIGH (ref 70–99)
Potassium: 4.8 mEq/L (ref 3.5–5.1)
Sodium: 138 mEq/L (ref 135–145)

## 2012-03-03 NOTE — Telephone Encounter (Signed)
Pt has been scheduled to see Willette Cluster on 03/04/12 at 9 am and is to have labs prior she will be in today and have that done.

## 2012-03-03 NOTE — Telephone Encounter (Signed)
Pt has diverticulosis and has had low abd and left side back pain that radiates to the back, 5 episodes of diarrhea today.  The pain began over night last night.  No fever.  No rectal bleeding.  No black stools.  Slightly dark because she is on Iron but she says nothing like they were when she had blood in the stool.  Can she be treated with out being seen. Pt prefers to try and treat before office appt.   01/30/12 colon showed moderate diverticulosis.  Please advise

## 2012-03-03 NOTE — Telephone Encounter (Signed)
Not sure this is diverticulitis (quite a lot of diarrhea).  She was hospitalized last month for GI bleeding.  I think she should be seen in office.  Would need labs (cbc, bmet, stool for c. Diff by PCR, routine culture).  Can see if extender has spot today or tomorrow or double book with me (10 patients in AM already) if no opening with extender or MD of the day.

## 2012-03-04 ENCOUNTER — Other Ambulatory Visit: Payer: Medicare Other

## 2012-03-04 ENCOUNTER — Ambulatory Visit (INDEPENDENT_AMBULATORY_CARE_PROVIDER_SITE_OTHER): Payer: Medicare Other | Admitting: Nurse Practitioner

## 2012-03-04 ENCOUNTER — Encounter: Payer: Self-pay | Admitting: Nurse Practitioner

## 2012-03-04 VITALS — BP 142/80 | HR 83 | Ht 64.0 in | Wt 139.6 lb

## 2012-03-04 DIAGNOSIS — R197 Diarrhea, unspecified: Secondary | ICD-10-CM

## 2012-03-04 NOTE — Progress Notes (Signed)
  Denise Hoffman [id number] 1936-08-07   HISTORY OR PRESENT ILLNESS :  Patient is a 76 year old female known to Dr. Christella Hartigan for a history of a pancreatic cyst and adenomatous colon polyps. She is s/p whipple surgery 2011. Patient hospitalized in late April 2012 with black stools and chest pain. Bleeding began one day after laparoscopic hernia repair. An inpatient EGD and colonoscopy were negative. Plan was for bleeding scan and /or capsule endoscopy if bleeding recurred. She had labs this am and hemoglobin up to 11.4 now. She has completed 30 days of iron and has a follow up with PCP on Monday.  Patient presents with acute diarrhea. She has 6 loose stools yesterday but hasn't had any today. No associated abdominal pain, nausea or fevers. No rectal bleeding. She has brought in stools samples as ordered today by Dr. Christella Hartigan.  She had hernia repair in late April and may have gotten antibiotics then but no antibiotic use recently.   Current Medications, Allergies, Past Medical History, Past Surgical History, Family History and Social History were reviewed in [employer] record.   PHYSICAL EXAMINATION : General:  Well developed  female in no acute distress Head: Normocephalic and atraumatic Eyes:  sclerae anicteric,conjunctive pink. Ears: Normal auditory acuity Neck: Supple, no masses.  Lungs: Clear throughout to auscultation Heart: Regular rate and rhythm Abdomen: Soft, nondistended, mild diffuse lower abdominal tenderness.  No masses or hepatomegaly noted. Normal bowel sounds Rectal: not done Musculoskeletal: Symmetrical with no gross deformities  Skin: No lesions on visible extremities Extremities: No edema or deformities noted Neurological: Oriented, grossly nonfocal Cervical Nodes:  No significant cervical adenopathy Psychological:  Alert and cooperative. Normal mood and affect  ASSESSMENT AND PLAN :  1. Acute diarrhea. She had 6 episodes of loose stool  yesterday, none today. Suspect infectious.  Will proceed with stool studies previously ordered by Dr. Christella Hartigan. If stool studies are negative and diarrhea does not recur then no further testing is needed. Should diarrhea return over the weekend patient will call our on-call physician. Otherwise, I will call her with stool study results early next week.  2. GI bleed (melena) April 2013 one day following laparoscopic hernia repair. EGD and colon were negative. No further bleeding. Hemoglobin up to 11.4 after 30 days of iron. Patient to see PCP on Monday. Iron can be discontinued but will leave that to PCP's discretion.

## 2012-03-07 DIAGNOSIS — D500 Iron deficiency anemia secondary to blood loss (chronic): Secondary | ICD-10-CM | POA: Diagnosis not present

## 2012-03-07 DIAGNOSIS — R197 Diarrhea, unspecified: Secondary | ICD-10-CM | POA: Insufficient documentation

## 2012-03-07 LAB — STOOL CULTURE

## 2012-03-07 NOTE — Progress Notes (Signed)
I agree, great plan

## 2012-03-09 LAB — CLOSTRIDIUM DIFFICILE BY PCR: Toxigenic C. Difficile by PCR: NOT DETECTED

## 2012-03-10 ENCOUNTER — Telehealth: Payer: Self-pay | Admitting: Nurse Practitioner

## 2012-03-10 NOTE — Telephone Encounter (Signed)
Spoke with patient and gave her results as per Dr. Christella Hartigan note. She states she is better now.

## 2012-03-11 NOTE — Telephone Encounter (Signed)
thanks

## 2012-03-29 DIAGNOSIS — L255 Unspecified contact dermatitis due to plants, except food: Secondary | ICD-10-CM | POA: Diagnosis not present

## 2012-04-15 DIAGNOSIS — D500 Iron deficiency anemia secondary to blood loss (chronic): Secondary | ICD-10-CM | POA: Diagnosis not present

## 2012-05-20 ENCOUNTER — Other Ambulatory Visit (HOSPITAL_COMMUNITY): Payer: Self-pay | Admitting: Family Medicine

## 2012-05-20 DIAGNOSIS — Z1231 Encounter for screening mammogram for malignant neoplasm of breast: Secondary | ICD-10-CM

## 2012-06-01 DIAGNOSIS — N8111 Cystocele, midline: Secondary | ICD-10-CM | POA: Diagnosis not present

## 2012-06-22 ENCOUNTER — Ambulatory Visit (HOSPITAL_COMMUNITY): Payer: Medicare Other

## 2012-07-14 DIAGNOSIS — D500 Iron deficiency anemia secondary to blood loss (chronic): Secondary | ICD-10-CM | POA: Diagnosis not present

## 2012-07-14 DIAGNOSIS — Z23 Encounter for immunization: Secondary | ICD-10-CM | POA: Diagnosis not present

## 2012-07-21 ENCOUNTER — Ambulatory Visit (HOSPITAL_COMMUNITY)
Admission: RE | Admit: 2012-07-21 | Discharge: 2012-07-21 | Disposition: A | Discharge status: Home / Self Care | Payer: Medicare Other | Source: Ambulatory Visit | Attending: Family Medicine | Admitting: Family Medicine

## 2012-07-21 DIAGNOSIS — Z1231 Encounter for screening mammogram for malignant neoplasm of breast: Secondary | ICD-10-CM

## 2012-08-11 DIAGNOSIS — N8111 Cystocele, midline: Secondary | ICD-10-CM | POA: Diagnosis not present

## 2012-08-11 DIAGNOSIS — R350 Frequency of micturition: Secondary | ICD-10-CM | POA: Diagnosis not present

## 2012-08-29 DIAGNOSIS — M720 Palmar fascial fibromatosis [Dupuytren]: Secondary | ICD-10-CM | POA: Diagnosis not present

## 2012-08-29 DIAGNOSIS — M722 Plantar fascial fibromatosis: Secondary | ICD-10-CM | POA: Diagnosis not present

## 2012-11-07 DIAGNOSIS — H2510 Age-related nuclear cataract, unspecified eye: Secondary | ICD-10-CM | POA: Diagnosis not present

## 2012-11-07 DIAGNOSIS — H25019 Cortical age-related cataract, unspecified eye: Secondary | ICD-10-CM | POA: Diagnosis not present

## 2012-11-07 DIAGNOSIS — H1045 Other chronic allergic conjunctivitis: Secondary | ICD-10-CM | POA: Diagnosis not present

## 2012-11-08 ENCOUNTER — Other Ambulatory Visit (HOSPITAL_COMMUNITY): Payer: Self-pay | Admitting: Obstetrics

## 2012-11-08 DIAGNOSIS — M8580 Other specified disorders of bone density and structure, unspecified site: Secondary | ICD-10-CM

## 2012-11-08 DIAGNOSIS — N8111 Cystocele, midline: Secondary | ICD-10-CM | POA: Diagnosis not present

## 2012-11-15 ENCOUNTER — Ambulatory Visit (HOSPITAL_COMMUNITY)
Admission: RE | Admit: 2012-11-15 | Discharge: 2012-11-15 | Disposition: A | Discharge status: Home / Self Care | Payer: Medicare Other | Source: Ambulatory Visit | Attending: Obstetrics | Admitting: Obstetrics

## 2012-11-15 DIAGNOSIS — Z13820 Encounter for screening for osteoporosis: Secondary | ICD-10-CM | POA: Insufficient documentation

## 2012-11-15 DIAGNOSIS — M899 Disorder of bone, unspecified: Secondary | ICD-10-CM | POA: Diagnosis not present

## 2012-11-15 DIAGNOSIS — Z780 Asymptomatic menopausal state: Secondary | ICD-10-CM | POA: Insufficient documentation

## 2012-11-15 DIAGNOSIS — M949 Disorder of cartilage, unspecified: Secondary | ICD-10-CM | POA: Insufficient documentation

## 2012-11-15 DIAGNOSIS — M8580 Other specified disorders of bone density and structure, unspecified site: Secondary | ICD-10-CM

## 2012-12-27 DIAGNOSIS — D500 Iron deficiency anemia secondary to blood loss (chronic): Secondary | ICD-10-CM | POA: Diagnosis not present

## 2013-01-12 DIAGNOSIS — M899 Disorder of bone, unspecified: Secondary | ICD-10-CM | POA: Diagnosis not present

## 2013-01-12 DIAGNOSIS — N8111 Cystocele, midline: Secondary | ICD-10-CM | POA: Diagnosis not present

## 2013-01-12 DIAGNOSIS — N952 Postmenopausal atrophic vaginitis: Secondary | ICD-10-CM | POA: Diagnosis not present

## 2013-01-12 DIAGNOSIS — R82998 Other abnormal findings in urine: Secondary | ICD-10-CM | POA: Diagnosis not present

## 2013-01-23 DIAGNOSIS — M48061 Spinal stenosis, lumbar region without neurogenic claudication: Secondary | ICD-10-CM | POA: Diagnosis not present

## 2013-01-23 DIAGNOSIS — M47817 Spondylosis without myelopathy or radiculopathy, lumbosacral region: Secondary | ICD-10-CM | POA: Diagnosis not present

## 2013-01-23 DIAGNOSIS — Z79899 Other long term (current) drug therapy: Secondary | ICD-10-CM | POA: Diagnosis not present

## 2013-01-23 DIAGNOSIS — M4310 Spondylolisthesis, site unspecified: Secondary | ICD-10-CM | POA: Diagnosis not present

## 2013-01-24 DIAGNOSIS — M5126 Other intervertebral disc displacement, lumbar region: Secondary | ICD-10-CM | POA: Diagnosis not present

## 2013-01-24 DIAGNOSIS — M4310 Spondylolisthesis, site unspecified: Secondary | ICD-10-CM | POA: Diagnosis not present

## 2013-01-27 DIAGNOSIS — N281 Cyst of kidney, acquired: Secondary | ICD-10-CM | POA: Diagnosis not present

## 2013-02-08 DIAGNOSIS — R3989 Other symptoms and signs involving the genitourinary system: Secondary | ICD-10-CM | POA: Diagnosis not present

## 2013-02-08 DIAGNOSIS — B3731 Acute candidiasis of vulva and vagina: Secondary | ICD-10-CM | POA: Diagnosis not present

## 2013-02-08 DIAGNOSIS — B373 Candidiasis of vulva and vagina: Secondary | ICD-10-CM | POA: Diagnosis not present

## 2013-02-08 DIAGNOSIS — R300 Dysuria: Secondary | ICD-10-CM | POA: Diagnosis not present

## 2013-02-21 DIAGNOSIS — M48061 Spinal stenosis, lumbar region without neurogenic claudication: Secondary | ICD-10-CM | POA: Diagnosis not present

## 2013-02-21 DIAGNOSIS — M4310 Spondylolisthesis, site unspecified: Secondary | ICD-10-CM | POA: Diagnosis not present

## 2013-02-21 DIAGNOSIS — M47817 Spondylosis without myelopathy or radiculopathy, lumbosacral region: Secondary | ICD-10-CM | POA: Diagnosis not present

## 2013-03-23 DIAGNOSIS — R109 Unspecified abdominal pain: Secondary | ICD-10-CM | POA: Diagnosis not present

## 2013-03-24 ENCOUNTER — Other Ambulatory Visit: Payer: Self-pay | Admitting: Family Medicine

## 2013-03-24 DIAGNOSIS — R109 Unspecified abdominal pain: Secondary | ICD-10-CM

## 2013-03-30 ENCOUNTER — Encounter (INDEPENDENT_AMBULATORY_CARE_PROVIDER_SITE_OTHER): Payer: Self-pay

## 2013-03-31 ENCOUNTER — Ambulatory Visit
Admission: RE | Admit: 2013-03-31 | Discharge: 2013-03-31 | Disposition: A | Discharge status: Home / Self Care | Payer: Medicare Other | Source: Ambulatory Visit | Attending: Family Medicine | Admitting: Family Medicine

## 2013-03-31 DIAGNOSIS — N281 Cyst of kidney, acquired: Secondary | ICD-10-CM | POA: Diagnosis not present

## 2013-03-31 DIAGNOSIS — R109 Unspecified abdominal pain: Secondary | ICD-10-CM

## 2013-03-31 MED ORDER — IOHEXOL 300 MG/ML  SOLN
100.0000 mL | Freq: Once | INTRAMUSCULAR | Status: AC | PRN
  Administered 2013-03-31: 100 mL via INTRAVENOUS

## 2013-05-01 ENCOUNTER — Encounter (INDEPENDENT_AMBULATORY_CARE_PROVIDER_SITE_OTHER): Payer: Self-pay | Admitting: General Surgery

## 2013-05-01 ENCOUNTER — Ambulatory Visit (INDEPENDENT_AMBULATORY_CARE_PROVIDER_SITE_OTHER): Payer: Medicare Other | Admitting: General Surgery

## 2013-05-01 VITALS — BP 118/70 | HR 72 | Temp 99.4°F | Resp 15 | Ht 64.0 in | Wt 141.4 lb

## 2013-05-01 DIAGNOSIS — R1013 Epigastric pain: Secondary | ICD-10-CM

## 2013-05-01 NOTE — Progress Notes (Signed)
Patient ID: Denise Hoffman, female   DOB: 24-Aug-1936, 77 y.o.   MRN: [id number]  Chief Complaint  Patient presents with  . New Evaluation    eval ventrical hernia    HPI Denise Hoffman is a 77 y.o. female.  Referred by Dr Catha Gosselin HPI This is a 77 year old female who has a history significant in November of 2011 of undergoing a pancreaticoduodenectomy at [facility]. This was for a benign pancreatic cyst. It appears she also underwent a wedge excision of a hemangioma of her liver at the same time. She did well after that. She was having some severe epigastric pain occasionally in a year later she was reevaluated there and ended up appearing to have a incisional hernia to be present. She then in 2013 underwent a laparoscopic ventral hernia repair. I have reviewed his operative report and is attached to my note. This was done with a 12 cm parietex mesh. Since then she has done well. She still continues to have very severe epigastric pain occasionally that is not related to anything that she can identify. This happened 4 times over the last about a year. This did not get any better after she had her hernia repaired. The pain is very intermittent that can be severe enough to cause her to double over and be almost incapacitating for some time. She had no nausea or vomiting. She is eating well. Her bowel movements are at her baseline. She comes in today to discuss the options. She recently also had a CT scan which shows no real recurrent hernia.Interestingly when this pain does have been the only thing that helps it is application of a lidocaine patch.  Past Medical History  Diagnosis Date  . Pancreatic cyst   . Hernia   . Hypertension     Past Surgical History  Procedure Laterality Date  . Stomach surgery    . Hernia repair      left hernia  . Esophagogastroduodenoscopy  01/28/2012    Procedure: ESOPHAGOGASTRODUODENOSCOPY (EGD);  Surgeon: Louis Meckel, MD;  Location: [facility]  END[facility];  Service: Endoscopy;  Laterality: N/A;  . Colonoscopy  01/30/2012    Procedure: COLONOSCOPY;  Surgeon: Louis Meckel, MD;  Location: [facility] ENDOSCOPY;  Service: Endoscopy;  Laterality: N/A;    Family History  Problem Relation Age of Onset  . Breast cancer Sister   . Stroke Mother   . Colon cancer Brother     Social History History  Substance Use Topics  . Smoking status: Never Smoker   . Smokeless tobacco: Never Used  . Alcohol Use: No    Allergies  Allergen Reactions  . Amoxicillin-Pot Clavulanate   . Morphine   . Prednisone   . Sulfonamide Derivatives     Current Outpatient Prescriptions  Medication Sig Dispense Refill  . ALPRAZolam (XANAX) 0.5 MG tablet Take 0.5 mg by mouth at bedtime as needed.      Marland Kitchen aspirin 81 MG tablet Take 1 tablet (81 mg total) by mouth daily. -HOLD FOR ONE WEEK; THEN RESUMED 81MG  ENTERIC COATED DAILY ASPIRIN.      . calcium citrate-vitamin D (CITRACAL+D) 315-200 MG-UNIT per tablet Take 1 tablet by mouth 2 (two) times daily.      . cholecalciferol (VITAMIN D) 1000 UNITS tablet Take 1,000 Units by mouth daily.      Marland Kitchen estradiol (ESTRACE) 0.1 MG/GM vaginal cream Place 2 g vaginally as directed.      . hydrochlorothiazide (HYDRODIURIL) 25 MG tablet       .  lidocaine (LIDODERM) 5 % Place 1 patch onto the skin daily. Remove & Discard patch within 12 hours or as directed by MD      . losartan (COZAAR) 50 MG tablet Take 50 mg by mouth daily.      Marland Kitchen lubiprostone (AMITIZA) 24 MCG capsule Take 24 mcg by mouth daily with breakfast.      . metoprolol (LOPRESSOR) 100 MG tablet Take 0.5 tablets (50 mg total) by mouth 2 (two) times daily.       No current facility-administered medications for this visit.    Review of Systems Review of Systems  Constitutional: Negative for fever, chills and unexpected weight change.  HENT: Negative for hearing loss, congestion, sore throat, trouble swallowing and voice change.   Eyes: Negative for visual disturbance.    Respiratory: Negative for cough and wheezing.   Cardiovascular: Negative for chest pain, palpitations and leg swelling.  Gastrointestinal: Negative for nausea, vomiting, abdominal pain, diarrhea, constipation, blood in stool, abdominal distention and anal bleeding.  Genitourinary: Negative for hematuria, vaginal bleeding and difficulty urinating.  Musculoskeletal: Negative for arthralgias.  Skin: Negative for rash and wound.  Neurological: Negative for seizures, syncope and headaches.  Hematological: Negative for adenopathy. Does not bruise/bleed easily.  Psychiatric/Behavioral: Negative for confusion.    Blood pressure 118/70, pulse 72, temperature 99.4 F (37.4 C), temperature source Temporal, resp. rate 15, height 5\' 4"  (1.626 m), weight 141 lb 6.4 oz (64.139 kg).  Physical Exam Physical Exam  Vitals reviewed. Constitutional: She is oriented to person, place, and time. She appears well-developed and well-nourished.  Eyes: No scleral icterus.  Abdominal: Soft. Bowel sounds are normal. She exhibits no distension. There is no tenderness. No hernia. Hernia confirmed negative in the ventral area.    Neurological: She is alert and oriented to person, place, and time.    Data Reviewed ENDOSCOPY PROCEDURE REPORT  PATIENT: Denise Hoffman MR#: [id number]  BIRTHDATE: 07-09-1936, 75 yrs. old GENDER: female  ENDOSCOPIST: Barbette Hair. Arlyce Dice, MD  Referred by: Catha Gosselin, M.D.  PROCEDURE DATE: 01/28/2012  PROCEDURE: EGD, diagnostic 43235  ASA CLASS: Class II  INDICATIONS: melena  MEDICATIONS: These medications were titrated to patient response  per physician's verbal order, Fentanyl 25 mcg IV, Versed 2 mg IV,  glycopyrrolate (Robinal) 0.2 mg IV  TOPICAL ANESTHETIC: Cetacaine Spray  DESCRIPTION OF PROCEDURE: After the risks and benefits of the  procedure were explained, informed consent was obtained. The  Pentax Gastroscope [id number] endoscope was introduced through the  mouth and  advanced to the proximal jejunum. The instrument was  slowly withdrawn as the mucosa was fully examined.  <<PROCEDUREIMAGES>>  The upper, middle, and distal third of the esophagus were  carefully inspected and no abnormalities were noted. The z-line  was well seen at the GEJ. The endoscope was pushed into the fundus  which was normal including a retroflexed view. The antrum,gastric  body, first and second part of the duodenum were unremarkable.  normal postoperative changes with a pyloric sparing Whipple (see  image001, image002, image003, image004, and image005). No fresh or  old blood Retroflexed views revealed no abnormalities. The  scope was then withdrawn from the patient and the procedure  completed.  COMPLICATIONS: None  ENDOSCOPIC IMPRESSION:  1) Normal EGD  RECOMMENDATIONS:  1) Proceed with a Colonscopy.  CT ABDOMEN WITH CONTRAST  Technique: Multidetector CT imaging of the abdomen was performed  following the standard protocol during bolus administration of  intravenous contrast.  Contrast: OMNIPAQUE IOHEXOL 300 MG/ML SOLN  Comparison: Abdominal pelvic CT 01/29/2012.  Findings: The overall basilar aeration has improved. There is mild  residual bibasilar atelectasis or scarring. There is no pleural or  pericardial effusion. Coronary artery calcifications are noted.  There is stable pneumobilia status post Whipple procedure. There  is a stable subcapsular cyst anteriorly in the left hepatic lobe.  There is stable mild biliary dilatation inferiorly in the right  hepatic lobe. The spleen, pancreatic remnant and adrenal glands  appear stable with a small amount of air in the pancreatic duct.  Right renal cysts are stable. The left kidney appears normal.  There is no hydronephrosis.  There is no residual gastric wall thickening or surrounding  inflammatory change. There is no adenopathy or extraluminal fluid  collection. The visualized small bowel and colon appear  stable.  Postsurgical changes within the anterior abdominal wall have nearly  resolved. There is minimal residual heterogeneity of the fat  anterior to the mesh for the repaired ventral hernia. There is a  stable tiny periumbilical hernia containing only fat.  The bones appear stable. There is a prominent hemangioma within  the L1 vertebral body and a degenerative anterolisthesis at L5-S1.  The pelvis was not imaged.  IMPRESSION:  1. Interval resolution of previously demonstrated inflammatory  changes in the upper abdomen. No acute abdominal findings  identified.  2. Stable pneumobilia status post Whipple procedure.  3. Stable right renal cysts.  4. Resolving postsurgical changes within the anterior abdominal  wall. No significant recurrent hernia.    Vito Berger Date of Procedure: 01/21/2012  DOB: 05/11/1936 Age: 89 Operative Report Attending: Tito Dine, MD  PREOPERATIVE DIAGNOSIS: Incisional hernia.  POSTOPERATIVE DIAGNOSIS: Incisional hernia.  PROCEDURES: Laparoscopic lysis of adhesions with incisional hernia repair using a 12 cm Parietex mesh.  SURGEON: Dr. Kathrynn Ducking.  ANESTHESIA: General.  DESCRIPTION OF PROCEDURE: Following consent, the patient was taken to the OR and in placed supine position on the operating table. Following induction of anesthesia, the patient was intubated. Attention was turned to the patient's abdomen which was prepped and draped in standard surgical fashion including Ioban. A left subcostal stab incision was made with a #15 blade and a Veress needle was advanced into the abdominal wall into the peritoneum and water droplet test was used to assure ease of water flow. Pneumoperitoneum was developed to 15 mmHg with opening pressure 9 mmHg. Following this, the Veress was exchanged for a 5 mm trocar. The 5 mm 30-degree scope was used for the entirety of the case. The intra-abdominal contents were evaluated and there were multiple adhesions to  the bilateral subcostal of which photo documentation was obtained. The patient's small bowel was adherent closely to the epigastric and right upper quadrant at the patient's site of prior pain. This was also noted to be the site of hernia noted on the CT scan. An additional 5 mm port was placed in the left upper quadrant under direct visualization to perform lysis of adhesions using sharp dissection without injury to the bowel. Following complete lysis of adhesions, the abdominal wall was assessed. The falciform ligament was ultimately taken from the abdominal wall using 5 mm LigaSure. The fascial weakness was identified and the epigastric correlated with the CT findings. This was ultimately measured out and we found that the 12 cm Parietex mesh would be sufficient. A 12 cm Parietex mesh was fashioned for #1 Tycron stay sutures at 12, 3, 6 and 9 o'clock positions. The left subcostal 5 mm port was the exchanged for  a 12 mm port under direct visualization. The mesh was moistened, rolled and placed through the 12 mm working intra-abdominal. Endo Close was used to retrieve the stay sutures through the abdominal wall with adequate overlap of the incisional hernia. These were tied. An AbsorbaTack was then used to circumferentially tack the mesh to the abdominal wall in 1 cm increments. Following this, hemostasis was noted. The 12 mm port was removed. The fascia was closed using #1 Vicryl to tie the Endo Close.   The 5 mm port was removed. The skin was closed at all sites using 4-0 Biosyn suture. Dermabond was used at all incision sites. The patient was awakened, extubated, and transferred to PACU in stable condition.  Darel Hong, MD Department of Surgery ELECTRONICALLY SIGNED ON Feb 11, 2012 AT 9:42:35 AM DD: 01/21/2012 DT: 01/21/2012 MEDQ/JOB: [id number]   Assessment    Likely abdominal wall pain     Plan    She has multiple possibilities for the source of her epigastric pain. She has  undergone really a fairly thorough evaluation for this. She had her hernia repaired without any real help of this particular pain had been occurring. She also has had a recent CT scan that shows these hernia defects still to be in place but it appears that there will covered by a patch. She also underwent an EGD for fairly similar symptoms just over a year ago that is essentially normal also. I think this may be abdominal wall pain as it does get better with local therapy. I do not think there is any indication for laparoscopy a reevaluation for any further hernia. I will ask Dr. Christella Hartigan his opinion. If he thinks there is any further indication did discuss an endoscopy I'll have him go see her. Otherwise I think continue with the current therapy would be reasonable.        Wania Longstreth 05/01/2013, 2:34 PM

## 2013-05-02 ENCOUNTER — Telehealth (INDEPENDENT_AMBULATORY_CARE_PROVIDER_SITE_OTHER): Payer: Self-pay

## 2013-05-02 NOTE — Telephone Encounter (Signed)
Called pt to notify her that Dr Dwain Sarna did speak to Dr Christella Hartigan. Dr Christella Hartigan doesn't think the pt needs anything  further just continue local therapy with lidocaine patches. Pt advised she can see Dr Christella Hartigan if she wishes too but the pt has had a thorough work up for now. The pt understands and is ok with the info.

## 2013-05-10 DIAGNOSIS — N8111 Cystocele, midline: Secondary | ICD-10-CM | POA: Diagnosis not present

## 2013-07-03 ENCOUNTER — Other Ambulatory Visit (HOSPITAL_COMMUNITY): Payer: Self-pay | Admitting: Family Medicine

## 2013-07-03 DIAGNOSIS — Z1231 Encounter for screening mammogram for malignant neoplasm of breast: Secondary | ICD-10-CM

## 2013-07-04 DIAGNOSIS — D500 Iron deficiency anemia secondary to blood loss (chronic): Secondary | ICD-10-CM | POA: Diagnosis not present

## 2013-07-10 DIAGNOSIS — Z23 Encounter for immunization: Secondary | ICD-10-CM | POA: Diagnosis not present

## 2013-07-24 ENCOUNTER — Ambulatory Visit (HOSPITAL_COMMUNITY)
Admission: RE | Admit: 2013-07-24 | Discharge: 2013-07-24 | Disposition: A | Discharge status: Home / Self Care | Payer: Medicare Other | Source: Ambulatory Visit | Attending: Family Medicine | Admitting: Family Medicine

## 2013-07-24 DIAGNOSIS — Z1231 Encounter for screening mammogram for malignant neoplasm of breast: Secondary | ICD-10-CM | POA: Diagnosis not present

## 2013-08-10 DIAGNOSIS — N3943 Post-void dribbling: Secondary | ICD-10-CM | POA: Diagnosis not present

## 2013-08-10 DIAGNOSIS — N898 Other specified noninflammatory disorders of vagina: Secondary | ICD-10-CM | POA: Diagnosis not present

## 2013-08-10 DIAGNOSIS — N8111 Cystocele, midline: Secondary | ICD-10-CM | POA: Diagnosis not present

## 2013-10-19 DIAGNOSIS — N8111 Cystocele, midline: Secondary | ICD-10-CM | POA: Diagnosis not present

## 2014-01-17 DIAGNOSIS — N8111 Cystocele, midline: Secondary | ICD-10-CM | POA: Diagnosis not present

## 2014-01-19 DIAGNOSIS — K862 Cyst of pancreas: Secondary | ICD-10-CM | POA: Diagnosis not present

## 2014-01-19 DIAGNOSIS — K589 Irritable bowel syndrome without diarrhea: Secondary | ICD-10-CM | POA: Diagnosis not present

## 2014-01-19 DIAGNOSIS — I10 Essential (primary) hypertension: Secondary | ICD-10-CM | POA: Diagnosis not present

## 2014-01-19 DIAGNOSIS — Z23 Encounter for immunization: Secondary | ICD-10-CM | POA: Diagnosis not present

## 2014-01-19 DIAGNOSIS — K863 Pseudocyst of pancreas: Secondary | ICD-10-CM | POA: Diagnosis not present

## 2014-01-22 DIAGNOSIS — K589 Irritable bowel syndrome without diarrhea: Secondary | ICD-10-CM | POA: Diagnosis not present

## 2014-01-22 DIAGNOSIS — R799 Abnormal finding of blood chemistry, unspecified: Secondary | ICD-10-CM | POA: Diagnosis not present

## 2014-01-22 DIAGNOSIS — I10 Essential (primary) hypertension: Secondary | ICD-10-CM | POA: Diagnosis not present

## 2014-01-22 DIAGNOSIS — K862 Cyst of pancreas: Secondary | ICD-10-CM | POA: Diagnosis not present

## 2014-01-22 DIAGNOSIS — K863 Pseudocyst of pancreas: Secondary | ICD-10-CM | POA: Diagnosis not present

## 2014-04-10 DIAGNOSIS — Z01419 Encounter for gynecological examination (general) (routine) without abnormal findings: Secondary | ICD-10-CM | POA: Diagnosis not present

## 2014-04-10 DIAGNOSIS — N8111 Cystocele, midline: Secondary | ICD-10-CM | POA: Diagnosis not present

## 2014-04-10 DIAGNOSIS — Z124 Encounter for screening for malignant neoplasm of cervix: Secondary | ICD-10-CM | POA: Diagnosis not present

## 2014-04-11 DIAGNOSIS — Z23 Encounter for immunization: Secondary | ICD-10-CM | POA: Diagnosis not present

## 2014-06-28 DIAGNOSIS — R799 Abnormal finding of blood chemistry, unspecified: Secondary | ICD-10-CM | POA: Diagnosis not present

## 2014-06-28 DIAGNOSIS — R946 Abnormal results of thyroid function studies: Secondary | ICD-10-CM | POA: Diagnosis not present

## 2014-06-28 DIAGNOSIS — K862 Cyst of pancreas: Secondary | ICD-10-CM | POA: Diagnosis not present

## 2014-06-28 DIAGNOSIS — K863 Pseudocyst of pancreas: Secondary | ICD-10-CM | POA: Diagnosis not present

## 2014-06-28 DIAGNOSIS — K589 Irritable bowel syndrome without diarrhea: Secondary | ICD-10-CM | POA: Diagnosis not present

## 2014-06-28 DIAGNOSIS — I10 Essential (primary) hypertension: Secondary | ICD-10-CM | POA: Diagnosis not present

## 2014-07-09 DIAGNOSIS — M79672 Pain in left foot: Secondary | ICD-10-CM | POA: Diagnosis not present

## 2014-07-09 DIAGNOSIS — M25512 Pain in left shoulder: Secondary | ICD-10-CM | POA: Diagnosis not present

## 2014-07-09 DIAGNOSIS — M4727 Other spondylosis with radiculopathy, lumbosacral region: Secondary | ICD-10-CM | POA: Diagnosis not present

## 2014-07-11 DIAGNOSIS — Z23 Encounter for immunization: Secondary | ICD-10-CM | POA: Diagnosis not present

## 2014-07-11 DIAGNOSIS — N8111 Cystocele, midline: Secondary | ICD-10-CM | POA: Diagnosis not present

## 2014-07-12 DIAGNOSIS — R079 Chest pain, unspecified: Secondary | ICD-10-CM | POA: Diagnosis not present

## 2014-07-24 DIAGNOSIS — M79672 Pain in left foot: Secondary | ICD-10-CM | POA: Diagnosis not present

## 2014-07-25 DIAGNOSIS — Z1231 Encounter for screening mammogram for malignant neoplasm of breast: Secondary | ICD-10-CM | POA: Diagnosis not present

## 2014-08-03 DIAGNOSIS — M79672 Pain in left foot: Secondary | ICD-10-CM | POA: Diagnosis not present

## 2014-08-10 DIAGNOSIS — S86312A Strain of muscle(s) and tendon(s) of peroneal muscle group at lower leg level, left leg, initial encounter: Secondary | ICD-10-CM | POA: Diagnosis not present

## 2014-08-13 DIAGNOSIS — S86312D Strain of muscle(s) and tendon(s) of peroneal muscle group at lower leg level, left leg, subsequent encounter: Secondary | ICD-10-CM | POA: Diagnosis not present

## 2014-08-13 DIAGNOSIS — M722 Plantar fascial fibromatosis: Secondary | ICD-10-CM | POA: Diagnosis not present

## 2014-08-15 DIAGNOSIS — S86312D Strain of muscle(s) and tendon(s) of peroneal muscle group at lower leg level, left leg, subsequent encounter: Secondary | ICD-10-CM | POA: Diagnosis not present

## 2014-08-15 DIAGNOSIS — M722 Plantar fascial fibromatosis: Secondary | ICD-10-CM | POA: Diagnosis not present

## 2014-08-20 DIAGNOSIS — M722 Plantar fascial fibromatosis: Secondary | ICD-10-CM | POA: Diagnosis not present

## 2014-08-20 DIAGNOSIS — S86312D Strain of muscle(s) and tendon(s) of peroneal muscle group at lower leg level, left leg, subsequent encounter: Secondary | ICD-10-CM | POA: Diagnosis not present

## 2014-08-20 DIAGNOSIS — M79672 Pain in left foot: Secondary | ICD-10-CM | POA: Diagnosis not present

## 2014-08-22 DIAGNOSIS — M722 Plantar fascial fibromatosis: Secondary | ICD-10-CM | POA: Diagnosis not present

## 2014-08-22 DIAGNOSIS — S86312D Strain of muscle(s) and tendon(s) of peroneal muscle group at lower leg level, left leg, subsequent encounter: Secondary | ICD-10-CM | POA: Diagnosis not present

## 2014-08-27 DIAGNOSIS — M722 Plantar fascial fibromatosis: Secondary | ICD-10-CM | POA: Diagnosis not present

## 2014-08-27 DIAGNOSIS — S86312D Strain of muscle(s) and tendon(s) of peroneal muscle group at lower leg level, left leg, subsequent encounter: Secondary | ICD-10-CM | POA: Diagnosis not present

## 2014-08-29 DIAGNOSIS — S86312D Strain of muscle(s) and tendon(s) of peroneal muscle group at lower leg level, left leg, subsequent encounter: Secondary | ICD-10-CM | POA: Diagnosis not present

## 2014-08-29 DIAGNOSIS — M722 Plantar fascial fibromatosis: Secondary | ICD-10-CM | POA: Diagnosis not present

## 2014-09-03 DIAGNOSIS — M722 Plantar fascial fibromatosis: Secondary | ICD-10-CM | POA: Diagnosis not present

## 2014-09-03 DIAGNOSIS — S86312D Strain of muscle(s) and tendon(s) of peroneal muscle group at lower leg level, left leg, subsequent encounter: Secondary | ICD-10-CM | POA: Diagnosis not present

## 2014-09-05 DIAGNOSIS — M722 Plantar fascial fibromatosis: Secondary | ICD-10-CM | POA: Diagnosis not present

## 2014-09-05 DIAGNOSIS — S86312D Strain of muscle(s) and tendon(s) of peroneal muscle group at lower leg level, left leg, subsequent encounter: Secondary | ICD-10-CM | POA: Diagnosis not present

## 2014-09-13 DIAGNOSIS — M25512 Pain in left shoulder: Secondary | ICD-10-CM | POA: Diagnosis not present

## 2014-09-13 DIAGNOSIS — M4727 Other spondylosis with radiculopathy, lumbosacral region: Secondary | ICD-10-CM | POA: Diagnosis not present

## 2014-09-13 DIAGNOSIS — M79672 Pain in left foot: Secondary | ICD-10-CM | POA: Diagnosis not present

## 2014-09-17 DIAGNOSIS — H2513 Age-related nuclear cataract, bilateral: Secondary | ICD-10-CM | POA: Diagnosis not present

## 2014-09-17 DIAGNOSIS — H43812 Vitreous degeneration, left eye: Secondary | ICD-10-CM | POA: Diagnosis not present

## 2014-09-17 DIAGNOSIS — H5310 Unspecified subjective visual disturbances: Secondary | ICD-10-CM | POA: Diagnosis not present

## 2014-09-17 DIAGNOSIS — S86312D Strain of muscle(s) and tendon(s) of peroneal muscle group at lower leg level, left leg, subsequent encounter: Secondary | ICD-10-CM | POA: Diagnosis not present

## 2014-09-17 DIAGNOSIS — R51 Headache: Secondary | ICD-10-CM | POA: Diagnosis not present

## 2014-09-17 DIAGNOSIS — M722 Plantar fascial fibromatosis: Secondary | ICD-10-CM | POA: Diagnosis not present

## 2014-09-17 DIAGNOSIS — H40013 Open angle with borderline findings, low risk, bilateral: Secondary | ICD-10-CM | POA: Diagnosis not present

## 2014-09-17 DIAGNOSIS — H10523 Angular blepharoconjunctivitis, bilateral: Secondary | ICD-10-CM | POA: Diagnosis not present

## 2014-10-10 DIAGNOSIS — S86312D Strain of muscle(s) and tendon(s) of peroneal muscle group at lower leg level, left leg, subsequent encounter: Secondary | ICD-10-CM | POA: Diagnosis not present

## 2014-10-11 DIAGNOSIS — N952 Postmenopausal atrophic vaginitis: Secondary | ICD-10-CM | POA: Diagnosis not present

## 2014-10-11 DIAGNOSIS — N8111 Cystocele, midline: Secondary | ICD-10-CM | POA: Diagnosis not present

## 2014-10-18 DIAGNOSIS — S86312D Strain of muscle(s) and tendon(s) of peroneal muscle group at lower leg level, left leg, subsequent encounter: Secondary | ICD-10-CM | POA: Diagnosis not present

## 2014-10-18 DIAGNOSIS — M859 Disorder of bone density and structure, unspecified: Secondary | ICD-10-CM | POA: Diagnosis not present

## 2014-10-18 DIAGNOSIS — R8290 Unspecified abnormal findings in urine: Secondary | ICD-10-CM | POA: Diagnosis not present

## 2014-10-18 DIAGNOSIS — D126 Benign neoplasm of colon, unspecified: Secondary | ICD-10-CM | POA: Diagnosis not present

## 2014-10-18 DIAGNOSIS — Z0000 Encounter for general adult medical examination without abnormal findings: Secondary | ICD-10-CM | POA: Diagnosis not present

## 2014-10-18 DIAGNOSIS — N189 Chronic kidney disease, unspecified: Secondary | ICD-10-CM | POA: Diagnosis not present

## 2014-10-18 DIAGNOSIS — M8580 Other specified disorders of bone density and structure, unspecified site: Secondary | ICD-10-CM | POA: Diagnosis not present

## 2014-10-18 DIAGNOSIS — K580 Irritable bowel syndrome with diarrhea: Secondary | ICD-10-CM | POA: Diagnosis not present

## 2014-10-18 DIAGNOSIS — K219 Gastro-esophageal reflux disease without esophagitis: Secondary | ICD-10-CM | POA: Diagnosis not present

## 2014-10-18 DIAGNOSIS — I10 Essential (primary) hypertension: Secondary | ICD-10-CM | POA: Diagnosis not present

## 2014-10-19 DIAGNOSIS — R946 Abnormal results of thyroid function studies: Secondary | ICD-10-CM | POA: Diagnosis not present

## 2014-11-02 DIAGNOSIS — S86312D Strain of muscle(s) and tendon(s) of peroneal muscle group at lower leg level, left leg, subsequent encounter: Secondary | ICD-10-CM | POA: Diagnosis not present

## 2014-11-08 DIAGNOSIS — H40013 Open angle with borderline findings, low risk, bilateral: Secondary | ICD-10-CM | POA: Diagnosis not present

## 2014-11-08 DIAGNOSIS — H43813 Vitreous degeneration, bilateral: Secondary | ICD-10-CM | POA: Diagnosis not present

## 2014-11-08 DIAGNOSIS — H25813 Combined forms of age-related cataract, bilateral: Secondary | ICD-10-CM | POA: Diagnosis not present

## 2014-11-14 DIAGNOSIS — R21 Rash and other nonspecific skin eruption: Secondary | ICD-10-CM | POA: Diagnosis not present

## 2014-12-07 DIAGNOSIS — R946 Abnormal results of thyroid function studies: Secondary | ICD-10-CM | POA: Diagnosis not present

## 2015-01-11 DIAGNOSIS — N8111 Cystocele, midline: Secondary | ICD-10-CM | POA: Diagnosis not present

## 2015-01-11 DIAGNOSIS — K589 Irritable bowel syndrome without diarrhea: Secondary | ICD-10-CM | POA: Diagnosis not present

## 2015-03-07 DIAGNOSIS — M4727 Other spondylosis with radiculopathy, lumbosacral region: Secondary | ICD-10-CM | POA: Diagnosis not present

## 2015-03-07 DIAGNOSIS — M25572 Pain in left ankle and joints of left foot: Secondary | ICD-10-CM | POA: Diagnosis not present

## 2015-03-07 DIAGNOSIS — M79672 Pain in left foot: Secondary | ICD-10-CM | POA: Diagnosis not present

## 2015-04-08 DIAGNOSIS — J0100 Acute maxillary sinusitis, unspecified: Secondary | ICD-10-CM | POA: Diagnosis not present

## 2015-04-23 DIAGNOSIS — N8111 Cystocele, midline: Secondary | ICD-10-CM | POA: Diagnosis not present

## 2015-04-29 DIAGNOSIS — M6700 Short Achilles tendon (acquired), unspecified ankle: Secondary | ICD-10-CM | POA: Diagnosis not present

## 2015-04-29 DIAGNOSIS — M25572 Pain in left ankle and joints of left foot: Secondary | ICD-10-CM | POA: Diagnosis not present

## 2015-04-29 DIAGNOSIS — M7670 Peroneal tendinitis, unspecified leg: Secondary | ICD-10-CM | POA: Diagnosis not present

## 2015-06-05 DIAGNOSIS — R1032 Left lower quadrant pain: Secondary | ICD-10-CM | POA: Diagnosis not present

## 2015-06-05 DIAGNOSIS — Z23 Encounter for immunization: Secondary | ICD-10-CM | POA: Diagnosis not present

## 2015-06-13 DIAGNOSIS — R946 Abnormal results of thyroid function studies: Secondary | ICD-10-CM | POA: Diagnosis not present

## 2015-07-29 DIAGNOSIS — Z1231 Encounter for screening mammogram for malignant neoplasm of breast: Secondary | ICD-10-CM | POA: Diagnosis not present

## 2015-07-29 DIAGNOSIS — N8111 Cystocele, midline: Secondary | ICD-10-CM | POA: Diagnosis not present

## 2015-08-15 DIAGNOSIS — R946 Abnormal results of thyroid function studies: Secondary | ICD-10-CM | POA: Diagnosis not present

## 2015-10-25 DIAGNOSIS — M899 Disorder of bone, unspecified: Secondary | ICD-10-CM | POA: Diagnosis not present

## 2015-10-25 DIAGNOSIS — R8290 Unspecified abnormal findings in urine: Secondary | ICD-10-CM | POA: Diagnosis not present

## 2015-10-25 DIAGNOSIS — L989 Disorder of the skin and subcutaneous tissue, unspecified: Secondary | ICD-10-CM | POA: Diagnosis not present

## 2015-10-25 DIAGNOSIS — M8580 Other specified disorders of bone density and structure, unspecified site: Secondary | ICD-10-CM | POA: Diagnosis not present

## 2015-10-25 DIAGNOSIS — D126 Benign neoplasm of colon, unspecified: Secondary | ICD-10-CM | POA: Diagnosis not present

## 2015-10-25 DIAGNOSIS — R946 Abnormal results of thyroid function studies: Secondary | ICD-10-CM | POA: Diagnosis not present

## 2015-10-25 DIAGNOSIS — N189 Chronic kidney disease, unspecified: Secondary | ICD-10-CM | POA: Diagnosis not present

## 2015-10-25 DIAGNOSIS — E039 Hypothyroidism, unspecified: Secondary | ICD-10-CM | POA: Diagnosis not present

## 2015-10-25 DIAGNOSIS — I10 Essential (primary) hypertension: Secondary | ICD-10-CM | POA: Diagnosis not present

## 2015-10-25 DIAGNOSIS — Z0000 Encounter for general adult medical examination without abnormal findings: Secondary | ICD-10-CM | POA: Diagnosis not present

## 2015-10-29 ENCOUNTER — Other Ambulatory Visit: Payer: Self-pay | Admitting: Obstetrics

## 2015-10-29 DIAGNOSIS — N952 Postmenopausal atrophic vaginitis: Secondary | ICD-10-CM | POA: Diagnosis not present

## 2015-10-29 DIAGNOSIS — N8111 Cystocele, midline: Secondary | ICD-10-CM | POA: Diagnosis not present

## 2015-10-29 DIAGNOSIS — E2839 Other primary ovarian failure: Secondary | ICD-10-CM

## 2015-11-01 DIAGNOSIS — E871 Hypo-osmolality and hyponatremia: Secondary | ICD-10-CM | POA: Diagnosis not present

## 2015-11-01 DIAGNOSIS — R946 Abnormal results of thyroid function studies: Secondary | ICD-10-CM | POA: Diagnosis not present

## 2015-11-12 DIAGNOSIS — H2513 Age-related nuclear cataract, bilateral: Secondary | ICD-10-CM | POA: Diagnosis not present

## 2015-11-12 DIAGNOSIS — H40013 Open angle with borderline findings, low risk, bilateral: Secondary | ICD-10-CM | POA: Diagnosis not present

## 2015-11-22 ENCOUNTER — Inpatient Hospital Stay: Admission: RE | Admit: 2015-11-22 | Payer: Medicare Other | Source: Ambulatory Visit

## 2015-11-25 ENCOUNTER — Ambulatory Visit
Admission: RE | Admit: 2015-11-25 | Discharge: 2015-11-25 | Disposition: A | Discharge status: Home / Self Care | Payer: Medicare Other | Source: Ambulatory Visit | Attending: Obstetrics | Admitting: Obstetrics

## 2015-11-25 DIAGNOSIS — E2839 Other primary ovarian failure: Secondary | ICD-10-CM

## 2015-11-25 DIAGNOSIS — M810 Age-related osteoporosis without current pathological fracture: Secondary | ICD-10-CM | POA: Diagnosis not present

## 2015-12-23 DIAGNOSIS — R946 Abnormal results of thyroid function studies: Secondary | ICD-10-CM | POA: Diagnosis not present

## 2016-01-23 ENCOUNTER — Other Ambulatory Visit: Payer: Self-pay | Admitting: General Surgery

## 2016-01-23 DIAGNOSIS — R1013 Epigastric pain: Secondary | ICD-10-CM | POA: Diagnosis not present

## 2016-01-26 DIAGNOSIS — S40262A Insect bite (nonvenomous) of left shoulder, initial encounter: Secondary | ICD-10-CM | POA: Diagnosis not present

## 2016-01-26 DIAGNOSIS — W57XXXA Bitten or stung by nonvenomous insect and other nonvenomous arthropods, initial encounter: Secondary | ICD-10-CM | POA: Diagnosis not present

## 2016-01-28 DIAGNOSIS — N8111 Cystocele, midline: Secondary | ICD-10-CM | POA: Diagnosis not present

## 2016-01-28 DIAGNOSIS — M810 Age-related osteoporosis without current pathological fracture: Secondary | ICD-10-CM | POA: Diagnosis not present

## 2016-01-28 DIAGNOSIS — Z1321 Encounter for screening for nutritional disorder: Secondary | ICD-10-CM | POA: Diagnosis not present

## 2016-01-28 DIAGNOSIS — Z1389 Encounter for screening for other disorder: Secondary | ICD-10-CM | POA: Diagnosis not present

## 2016-01-28 DIAGNOSIS — M899 Disorder of bone, unspecified: Secondary | ICD-10-CM | POA: Diagnosis not present

## 2016-01-28 DIAGNOSIS — Z1329 Encounter for screening for other suspected endocrine disorder: Secondary | ICD-10-CM | POA: Diagnosis not present

## 2016-02-03 ENCOUNTER — Ambulatory Visit
Admission: RE | Admit: 2016-02-03 | Discharge: 2016-02-03 | Disposition: A | Discharge status: Home / Self Care | Payer: Medicare Other | Source: Ambulatory Visit | Attending: General Surgery | Admitting: General Surgery

## 2016-02-03 DIAGNOSIS — K5730 Diverticulosis of large intestine without perforation or abscess without bleeding: Secondary | ICD-10-CM | POA: Diagnosis not present

## 2016-02-03 DIAGNOSIS — R1013 Epigastric pain: Secondary | ICD-10-CM

## 2016-02-03 MED ORDER — IOPAMIDOL (ISOVUE-300) INJECTION 61%: 100.0000 mL | Freq: Once | INTRAVENOUS | Status: DC | PRN

## 2016-02-28 DIAGNOSIS — R079 Chest pain, unspecified: Secondary | ICD-10-CM | POA: Diagnosis not present

## 2016-03-02 DIAGNOSIS — J019 Acute sinusitis, unspecified: Secondary | ICD-10-CM | POA: Diagnosis not present

## 2016-03-02 DIAGNOSIS — H6693 Otitis media, unspecified, bilateral: Secondary | ICD-10-CM | POA: Diagnosis not present

## 2016-03-02 DIAGNOSIS — R197 Diarrhea, unspecified: Secondary | ICD-10-CM | POA: Diagnosis not present

## 2016-03-05 DIAGNOSIS — G894 Chronic pain syndrome: Secondary | ICD-10-CM | POA: Diagnosis not present

## 2016-03-05 DIAGNOSIS — M4727 Other spondylosis with radiculopathy, lumbosacral region: Secondary | ICD-10-CM | POA: Diagnosis not present

## 2016-03-09 DIAGNOSIS — L0390 Cellulitis, unspecified: Secondary | ICD-10-CM | POA: Diagnosis not present

## 2016-03-09 DIAGNOSIS — H6983 Other specified disorders of Eustachian tube, bilateral: Secondary | ICD-10-CM | POA: Diagnosis not present

## 2016-03-09 DIAGNOSIS — S70369A Insect bite (nonvenomous), unspecified thigh, initial encounter: Secondary | ICD-10-CM | POA: Diagnosis not present

## 2016-03-11 DIAGNOSIS — L0390 Cellulitis, unspecified: Secondary | ICD-10-CM | POA: Diagnosis not present

## 2016-03-30 DIAGNOSIS — R300 Dysuria: Secondary | ICD-10-CM | POA: Diagnosis not present

## 2016-03-30 DIAGNOSIS — L293 Anogenital pruritus, unspecified: Secondary | ICD-10-CM | POA: Diagnosis not present

## 2016-03-30 DIAGNOSIS — N390 Urinary tract infection, site not specified: Secondary | ICD-10-CM | POA: Diagnosis not present

## 2016-04-16 DIAGNOSIS — N390 Urinary tract infection, site not specified: Secondary | ICD-10-CM | POA: Diagnosis not present

## 2016-04-28 DIAGNOSIS — N8111 Cystocele, midline: Secondary | ICD-10-CM | POA: Diagnosis not present

## 2016-04-28 DIAGNOSIS — R3915 Urgency of urination: Secondary | ICD-10-CM | POA: Diagnosis not present

## 2016-04-28 DIAGNOSIS — N816 Rectocele: Secondary | ICD-10-CM | POA: Diagnosis not present

## 2016-04-28 DIAGNOSIS — R350 Frequency of micturition: Secondary | ICD-10-CM | POA: Diagnosis not present

## 2016-07-08 DIAGNOSIS — Z23 Encounter for immunization: Secondary | ICD-10-CM | POA: Diagnosis not present

## 2016-07-30 DIAGNOSIS — N952 Postmenopausal atrophic vaginitis: Secondary | ICD-10-CM | POA: Diagnosis not present

## 2016-07-30 DIAGNOSIS — Z1231 Encounter for screening mammogram for malignant neoplasm of breast: Secondary | ICD-10-CM | POA: Diagnosis not present

## 2016-07-30 DIAGNOSIS — Z124 Encounter for screening for malignant neoplasm of cervix: Secondary | ICD-10-CM | POA: Diagnosis not present

## 2016-07-30 DIAGNOSIS — N8111 Cystocele, midline: Secondary | ICD-10-CM | POA: Diagnosis not present

## 2016-07-30 DIAGNOSIS — M810 Age-related osteoporosis without current pathological fracture: Secondary | ICD-10-CM | POA: Diagnosis not present

## 2016-10-29 DIAGNOSIS — N8111 Cystocele, midline: Secondary | ICD-10-CM | POA: Diagnosis not present

## 2016-11-12 DIAGNOSIS — M899 Disorder of bone, unspecified: Secondary | ICD-10-CM | POA: Diagnosis not present

## 2016-11-12 DIAGNOSIS — K589 Irritable bowel syndrome without diarrhea: Secondary | ICD-10-CM | POA: Diagnosis not present

## 2016-11-12 DIAGNOSIS — I10 Essential (primary) hypertension: Secondary | ICD-10-CM | POA: Diagnosis not present

## 2016-11-12 DIAGNOSIS — Z86010 Personal history of colonic polyps: Secondary | ICD-10-CM | POA: Diagnosis not present

## 2016-11-12 DIAGNOSIS — Z0000 Encounter for general adult medical examination without abnormal findings: Secondary | ICD-10-CM | POA: Diagnosis not present

## 2016-11-12 DIAGNOSIS — M810 Age-related osteoporosis without current pathological fracture: Secondary | ICD-10-CM | POA: Diagnosis not present

## 2016-11-12 DIAGNOSIS — N189 Chronic kidney disease, unspecified: Secondary | ICD-10-CM | POA: Diagnosis not present

## 2016-11-12 DIAGNOSIS — E039 Hypothyroidism, unspecified: Secondary | ICD-10-CM | POA: Diagnosis not present

## 2016-11-12 DIAGNOSIS — R8290 Unspecified abnormal findings in urine: Secondary | ICD-10-CM | POA: Diagnosis not present

## 2016-11-16 DIAGNOSIS — H2513 Age-related nuclear cataract, bilateral: Secondary | ICD-10-CM | POA: Diagnosis not present

## 2016-11-16 DIAGNOSIS — H40013 Open angle with borderline findings, low risk, bilateral: Secondary | ICD-10-CM | POA: Diagnosis not present

## 2017-01-18 DIAGNOSIS — R350 Frequency of micturition: Secondary | ICD-10-CM | POA: Diagnosis not present

## 2017-01-18 DIAGNOSIS — N8111 Cystocele, midline: Secondary | ICD-10-CM | POA: Diagnosis not present

## 2017-01-18 DIAGNOSIS — N390 Urinary tract infection, site not specified: Secondary | ICD-10-CM | POA: Diagnosis not present

## 2017-03-02 DIAGNOSIS — R42 Dizziness and giddiness: Secondary | ICD-10-CM | POA: Diagnosis not present

## 2017-03-02 DIAGNOSIS — I10 Essential (primary) hypertension: Secondary | ICD-10-CM | POA: Diagnosis not present

## 2017-03-10 DIAGNOSIS — M4317 Spondylolisthesis, lumbosacral region: Secondary | ICD-10-CM | POA: Diagnosis not present

## 2017-03-10 DIAGNOSIS — M4727 Other spondylosis with radiculopathy, lumbosacral region: Secondary | ICD-10-CM | POA: Diagnosis not present

## 2017-03-10 DIAGNOSIS — M4807 Spinal stenosis, lumbosacral region: Secondary | ICD-10-CM | POA: Diagnosis not present

## 2017-03-10 DIAGNOSIS — G894 Chronic pain syndrome: Secondary | ICD-10-CM | POA: Diagnosis not present

## 2017-03-11 DIAGNOSIS — I10 Essential (primary) hypertension: Secondary | ICD-10-CM | POA: Diagnosis not present

## 2017-03-19 DIAGNOSIS — L821 Other seborrheic keratosis: Secondary | ICD-10-CM | POA: Diagnosis not present

## 2017-03-19 DIAGNOSIS — Z1283 Encounter for screening for malignant neoplasm of skin: Secondary | ICD-10-CM | POA: Diagnosis not present

## 2017-04-19 DIAGNOSIS — N898 Other specified noninflammatory disorders of vagina: Secondary | ICD-10-CM | POA: Diagnosis not present

## 2017-04-19 DIAGNOSIS — R8290 Unspecified abnormal findings in urine: Secondary | ICD-10-CM | POA: Diagnosis not present

## 2017-04-19 DIAGNOSIS — N8111 Cystocele, midline: Secondary | ICD-10-CM | POA: Diagnosis not present

## 2017-05-05 DIAGNOSIS — M4317 Spondylolisthesis, lumbosacral region: Secondary | ICD-10-CM | POA: Diagnosis not present

## 2017-05-05 DIAGNOSIS — M4727 Other spondylosis with radiculopathy, lumbosacral region: Secondary | ICD-10-CM | POA: Diagnosis not present

## 2017-05-05 DIAGNOSIS — G894 Chronic pain syndrome: Secondary | ICD-10-CM | POA: Diagnosis not present

## 2017-05-05 DIAGNOSIS — M4807 Spinal stenosis, lumbosacral region: Secondary | ICD-10-CM | POA: Diagnosis not present

## 2017-06-16 DIAGNOSIS — J069 Acute upper respiratory infection, unspecified: Secondary | ICD-10-CM | POA: Diagnosis not present

## 2017-07-20 DIAGNOSIS — R8290 Unspecified abnormal findings in urine: Secondary | ICD-10-CM | POA: Diagnosis not present

## 2017-07-20 DIAGNOSIS — N3020 Other chronic cystitis without hematuria: Secondary | ICD-10-CM | POA: Diagnosis not present

## 2017-07-20 DIAGNOSIS — N8111 Cystocele, midline: Secondary | ICD-10-CM | POA: Diagnosis not present

## 2017-08-02 DIAGNOSIS — Z1231 Encounter for screening mammogram for malignant neoplasm of breast: Secondary | ICD-10-CM | POA: Diagnosis not present

## 2017-08-04 DIAGNOSIS — N8111 Cystocele, midline: Secondary | ICD-10-CM | POA: Diagnosis not present

## 2017-08-04 DIAGNOSIS — N3020 Other chronic cystitis without hematuria: Secondary | ICD-10-CM | POA: Diagnosis not present

## 2017-10-13 DIAGNOSIS — N8111 Cystocele, midline: Secondary | ICD-10-CM | POA: Diagnosis not present

## 2017-10-29 DIAGNOSIS — R05 Cough: Secondary | ICD-10-CM | POA: Diagnosis not present

## 2017-10-29 DIAGNOSIS — J0110 Acute frontal sinusitis, unspecified: Secondary | ICD-10-CM | POA: Diagnosis not present

## 2017-10-29 DIAGNOSIS — J0100 Acute maxillary sinusitis, unspecified: Secondary | ICD-10-CM | POA: Diagnosis not present

## 2017-10-29 DIAGNOSIS — H6502 Acute serous otitis media, left ear: Secondary | ICD-10-CM | POA: Diagnosis not present

## 2017-11-05 DIAGNOSIS — G894 Chronic pain syndrome: Secondary | ICD-10-CM | POA: Diagnosis not present

## 2017-11-05 DIAGNOSIS — M4727 Other spondylosis with radiculopathy, lumbosacral region: Secondary | ICD-10-CM | POA: Diagnosis not present

## 2017-11-05 DIAGNOSIS — M4317 Spondylolisthesis, lumbosacral region: Secondary | ICD-10-CM | POA: Diagnosis not present

## 2017-11-05 DIAGNOSIS — M4807 Spinal stenosis, lumbosacral region: Secondary | ICD-10-CM | POA: Diagnosis not present

## 2017-11-26 DIAGNOSIS — N189 Chronic kidney disease, unspecified: Secondary | ICD-10-CM | POA: Diagnosis not present

## 2017-11-26 DIAGNOSIS — I10 Essential (primary) hypertension: Secondary | ICD-10-CM | POA: Diagnosis not present

## 2017-11-26 DIAGNOSIS — E559 Vitamin D deficiency, unspecified: Secondary | ICD-10-CM | POA: Diagnosis not present

## 2017-12-02 DIAGNOSIS — I10 Essential (primary) hypertension: Secondary | ICD-10-CM | POA: Diagnosis not present

## 2017-12-02 DIAGNOSIS — M48061 Spinal stenosis, lumbar region without neurogenic claudication: Secondary | ICD-10-CM | POA: Diagnosis not present

## 2017-12-02 DIAGNOSIS — N189 Chronic kidney disease, unspecified: Secondary | ICD-10-CM | POA: Diagnosis not present

## 2017-12-02 DIAGNOSIS — Z0000 Encounter for general adult medical examination without abnormal findings: Secondary | ICD-10-CM | POA: Diagnosis not present

## 2017-12-02 DIAGNOSIS — H6120 Impacted cerumen, unspecified ear: Secondary | ICD-10-CM | POA: Diagnosis not present

## 2017-12-02 DIAGNOSIS — M810 Age-related osteoporosis without current pathological fracture: Secondary | ICD-10-CM | POA: Diagnosis not present

## 2017-12-02 DIAGNOSIS — R8290 Unspecified abnormal findings in urine: Secondary | ICD-10-CM | POA: Diagnosis not present

## 2017-12-02 DIAGNOSIS — M25561 Pain in right knee: Secondary | ICD-10-CM | POA: Diagnosis not present

## 2017-12-02 DIAGNOSIS — Z86010 Personal history of colonic polyps: Secondary | ICD-10-CM | POA: Diagnosis not present

## 2017-12-02 DIAGNOSIS — E039 Hypothyroidism, unspecified: Secondary | ICD-10-CM | POA: Diagnosis not present

## 2017-12-06 DIAGNOSIS — M1711 Unilateral primary osteoarthritis, right knee: Secondary | ICD-10-CM | POA: Diagnosis not present

## 2017-12-23 DIAGNOSIS — H40013 Open angle with borderline findings, low risk, bilateral: Secondary | ICD-10-CM | POA: Diagnosis not present

## 2017-12-23 DIAGNOSIS — H2513 Age-related nuclear cataract, bilateral: Secondary | ICD-10-CM | POA: Diagnosis not present

## 2017-12-23 DIAGNOSIS — H43813 Vitreous degeneration, bilateral: Secondary | ICD-10-CM | POA: Diagnosis not present

## 2017-12-23 DIAGNOSIS — H5203 Hypermetropia, bilateral: Secondary | ICD-10-CM | POA: Diagnosis not present

## 2017-12-30 ENCOUNTER — Ambulatory Visit
Admission: RE | Admit: 2017-12-30 | Discharge: 2017-12-30 | Disposition: A | Discharge status: Home / Self Care | Payer: Medicare Other | Source: Ambulatory Visit | Attending: Family Medicine | Admitting: Family Medicine

## 2017-12-30 ENCOUNTER — Other Ambulatory Visit: Payer: Self-pay | Admitting: Family Medicine

## 2017-12-30 DIAGNOSIS — M542 Cervicalgia: Secondary | ICD-10-CM

## 2017-12-30 DIAGNOSIS — J3489 Other specified disorders of nose and nasal sinuses: Secondary | ICD-10-CM | POA: Diagnosis not present

## 2017-12-30 DIAGNOSIS — R51 Headache: Secondary | ICD-10-CM | POA: Diagnosis not present

## 2017-12-30 DIAGNOSIS — M48061 Spinal stenosis, lumbar region without neurogenic claudication: Secondary | ICD-10-CM | POA: Diagnosis not present

## 2017-12-30 DIAGNOSIS — J310 Chronic rhinitis: Secondary | ICD-10-CM | POA: Diagnosis not present

## 2018-01-11 DIAGNOSIS — N8111 Cystocele, midline: Secondary | ICD-10-CM | POA: Diagnosis not present

## 2018-02-01 DIAGNOSIS — Z049 Encounter for examination and observation for unspecified reason: Secondary | ICD-10-CM | POA: Diagnosis not present

## 2018-02-01 DIAGNOSIS — R51 Headache: Secondary | ICD-10-CM | POA: Diagnosis not present

## 2018-02-01 DIAGNOSIS — Z79899 Other long term (current) drug therapy: Secondary | ICD-10-CM | POA: Diagnosis not present

## 2018-02-02 ENCOUNTER — Other Ambulatory Visit: Payer: Self-pay | Admitting: Specialist

## 2018-02-02 ENCOUNTER — Ambulatory Visit: Payer: Medicare Other | Admitting: Neurology

## 2018-02-02 DIAGNOSIS — R51 Headache: Principal | ICD-10-CM

## 2018-02-02 DIAGNOSIS — R519 Headache, unspecified: Secondary | ICD-10-CM

## 2018-02-10 ENCOUNTER — Ambulatory Visit
Admission: RE | Admit: 2018-02-10 | Discharge: 2018-02-10 | Disposition: A | Discharge status: Home / Self Care | Payer: Medicare Other | Source: Ambulatory Visit | Attending: Specialist | Admitting: Specialist

## 2018-02-10 DIAGNOSIS — R51 Headache: Principal | ICD-10-CM

## 2018-02-10 DIAGNOSIS — R519 Headache, unspecified: Secondary | ICD-10-CM

## 2018-03-01 DIAGNOSIS — G518 Other disorders of facial nerve: Secondary | ICD-10-CM | POA: Diagnosis not present

## 2018-03-01 DIAGNOSIS — M542 Cervicalgia: Secondary | ICD-10-CM | POA: Diagnosis not present

## 2018-03-01 DIAGNOSIS — M791 Myalgia, unspecified site: Secondary | ICD-10-CM | POA: Diagnosis not present

## 2018-03-01 DIAGNOSIS — R51 Headache: Secondary | ICD-10-CM | POA: Diagnosis not present

## 2018-03-15 DIAGNOSIS — Z79899 Other long term (current) drug therapy: Secondary | ICD-10-CM | POA: Diagnosis not present

## 2018-03-15 DIAGNOSIS — R51 Headache: Secondary | ICD-10-CM | POA: Diagnosis not present

## 2018-03-15 DIAGNOSIS — M791 Myalgia, unspecified site: Secondary | ICD-10-CM | POA: Diagnosis not present

## 2018-03-15 DIAGNOSIS — G518 Other disorders of facial nerve: Secondary | ICD-10-CM | POA: Diagnosis not present

## 2018-03-15 DIAGNOSIS — M542 Cervicalgia: Secondary | ICD-10-CM | POA: Diagnosis not present

## 2018-03-29 DIAGNOSIS — R51 Headache: Secondary | ICD-10-CM | POA: Diagnosis not present

## 2018-03-29 DIAGNOSIS — G518 Other disorders of facial nerve: Secondary | ICD-10-CM | POA: Diagnosis not present

## 2018-03-29 DIAGNOSIS — M542 Cervicalgia: Secondary | ICD-10-CM | POA: Diagnosis not present

## 2018-03-29 DIAGNOSIS — M791 Myalgia, unspecified site: Secondary | ICD-10-CM | POA: Diagnosis not present

## 2018-04-05 DIAGNOSIS — R51 Headache: Secondary | ICD-10-CM | POA: Diagnosis not present

## 2018-04-20 DIAGNOSIS — M791 Myalgia, unspecified site: Secondary | ICD-10-CM | POA: Diagnosis not present

## 2018-04-20 DIAGNOSIS — R51 Headache: Secondary | ICD-10-CM | POA: Diagnosis not present

## 2018-04-20 DIAGNOSIS — M542 Cervicalgia: Secondary | ICD-10-CM | POA: Diagnosis not present

## 2018-04-20 DIAGNOSIS — G518 Other disorders of facial nerve: Secondary | ICD-10-CM | POA: Diagnosis not present

## 2018-04-25 DIAGNOSIS — R350 Frequency of micturition: Secondary | ICD-10-CM | POA: Diagnosis not present

## 2018-04-25 DIAGNOSIS — N8111 Cystocele, midline: Secondary | ICD-10-CM | POA: Diagnosis not present

## 2018-05-05 DIAGNOSIS — G518 Other disorders of facial nerve: Secondary | ICD-10-CM | POA: Diagnosis not present

## 2018-05-05 DIAGNOSIS — M542 Cervicalgia: Secondary | ICD-10-CM | POA: Diagnosis not present

## 2018-05-05 DIAGNOSIS — M791 Myalgia, unspecified site: Secondary | ICD-10-CM | POA: Diagnosis not present

## 2018-05-05 DIAGNOSIS — R51 Headache: Secondary | ICD-10-CM | POA: Diagnosis not present

## 2018-05-06 DIAGNOSIS — M4317 Spondylolisthesis, lumbosacral region: Secondary | ICD-10-CM | POA: Diagnosis not present

## 2018-05-06 DIAGNOSIS — G894 Chronic pain syndrome: Secondary | ICD-10-CM | POA: Diagnosis not present

## 2018-05-06 DIAGNOSIS — M4727 Other spondylosis with radiculopathy, lumbosacral region: Secondary | ICD-10-CM | POA: Diagnosis not present

## 2018-05-06 DIAGNOSIS — M4807 Spinal stenosis, lumbosacral region: Secondary | ICD-10-CM | POA: Diagnosis not present

## 2018-05-20 DIAGNOSIS — R51 Headache: Secondary | ICD-10-CM | POA: Diagnosis not present

## 2018-05-20 DIAGNOSIS — M791 Myalgia, unspecified site: Secondary | ICD-10-CM | POA: Diagnosis not present

## 2018-05-20 DIAGNOSIS — M542 Cervicalgia: Secondary | ICD-10-CM | POA: Diagnosis not present

## 2018-05-20 DIAGNOSIS — G518 Other disorders of facial nerve: Secondary | ICD-10-CM | POA: Diagnosis not present

## 2018-05-25 DIAGNOSIS — J3489 Other specified disorders of nose and nasal sinuses: Secondary | ICD-10-CM | POA: Diagnosis not present

## 2018-05-25 DIAGNOSIS — R51 Headache: Secondary | ICD-10-CM | POA: Diagnosis not present

## 2018-05-25 DIAGNOSIS — J324 Chronic pansinusitis: Secondary | ICD-10-CM | POA: Diagnosis not present

## 2018-07-04 DIAGNOSIS — M791 Myalgia, unspecified site: Secondary | ICD-10-CM | POA: Diagnosis not present

## 2018-07-04 DIAGNOSIS — R51 Headache: Secondary | ICD-10-CM | POA: Diagnosis not present

## 2018-07-04 DIAGNOSIS — G518 Other disorders of facial nerve: Secondary | ICD-10-CM | POA: Diagnosis not present

## 2018-07-04 DIAGNOSIS — M542 Cervicalgia: Secondary | ICD-10-CM | POA: Diagnosis not present

## 2018-07-05 DIAGNOSIS — R51 Headache: Secondary | ICD-10-CM | POA: Diagnosis not present

## 2018-07-05 DIAGNOSIS — J3489 Other specified disorders of nose and nasal sinuses: Secondary | ICD-10-CM | POA: Diagnosis not present

## 2018-07-05 DIAGNOSIS — J324 Chronic pansinusitis: Secondary | ICD-10-CM | POA: Diagnosis not present

## 2018-07-05 DIAGNOSIS — J329 Chronic sinusitis, unspecified: Secondary | ICD-10-CM | POA: Diagnosis not present

## 2018-07-13 DIAGNOSIS — G629 Polyneuropathy, unspecified: Secondary | ICD-10-CM | POA: Diagnosis not present

## 2018-07-13 DIAGNOSIS — G5600 Carpal tunnel syndrome, unspecified upper limb: Secondary | ICD-10-CM | POA: Diagnosis not present

## 2018-07-25 DIAGNOSIS — N8111 Cystocele, midline: Secondary | ICD-10-CM | POA: Diagnosis not present

## 2018-07-25 DIAGNOSIS — Z1231 Encounter for screening mammogram for malignant neoplasm of breast: Secondary | ICD-10-CM | POA: Diagnosis not present

## 2018-07-25 DIAGNOSIS — R350 Frequency of micturition: Secondary | ICD-10-CM | POA: Diagnosis not present

## 2018-07-26 DIAGNOSIS — J324 Chronic pansinusitis: Secondary | ICD-10-CM | POA: Diagnosis not present

## 2018-07-26 DIAGNOSIS — J0130 Acute sphenoidal sinusitis, unspecified: Secondary | ICD-10-CM | POA: Diagnosis not present

## 2018-08-12 DIAGNOSIS — R05 Cough: Secondary | ICD-10-CM | POA: Diagnosis not present

## 2018-08-12 DIAGNOSIS — R51 Headache: Secondary | ICD-10-CM | POA: Diagnosis not present

## 2018-08-12 DIAGNOSIS — R52 Pain, unspecified: Secondary | ICD-10-CM | POA: Diagnosis not present

## 2018-08-18 DIAGNOSIS — R05 Cough: Secondary | ICD-10-CM | POA: Diagnosis not present

## 2018-08-18 DIAGNOSIS — M5416 Radiculopathy, lumbar region: Secondary | ICD-10-CM | POA: Diagnosis not present

## 2018-08-23 ENCOUNTER — Other Ambulatory Visit: Payer: Self-pay | Admitting: Family Medicine

## 2018-08-23 DIAGNOSIS — M5416 Radiculopathy, lumbar region: Secondary | ICD-10-CM

## 2018-09-16 ENCOUNTER — Ambulatory Visit
Admission: RE | Admit: 2018-09-16 | Discharge: 2018-09-16 | Disposition: A | Discharge status: Home / Self Care | Payer: Medicare Other | Source: Ambulatory Visit | Attending: Family Medicine | Admitting: Family Medicine

## 2018-09-16 DIAGNOSIS — M5416 Radiculopathy, lumbar region: Secondary | ICD-10-CM

## 2018-09-16 DIAGNOSIS — M47816 Spondylosis without myelopathy or radiculopathy, lumbar region: Secondary | ICD-10-CM | POA: Diagnosis not present

## 2018-09-16 DIAGNOSIS — M48061 Spinal stenosis, lumbar region without neurogenic claudication: Secondary | ICD-10-CM | POA: Diagnosis not present

## 2018-09-23 ENCOUNTER — Other Ambulatory Visit: Payer: Self-pay | Admitting: Otolaryngology

## 2018-09-23 DIAGNOSIS — J330 Polyp of nasal cavity: Secondary | ICD-10-CM | POA: Diagnosis not present

## 2018-09-23 DIAGNOSIS — J322 Chronic ethmoidal sinusitis: Secondary | ICD-10-CM | POA: Diagnosis not present

## 2018-09-23 DIAGNOSIS — J321 Chronic frontal sinusitis: Secondary | ICD-10-CM | POA: Diagnosis not present

## 2018-09-23 DIAGNOSIS — J329 Chronic sinusitis, unspecified: Secondary | ICD-10-CM | POA: Diagnosis not present

## 2018-09-23 DIAGNOSIS — J324 Chronic pansinusitis: Secondary | ICD-10-CM | POA: Diagnosis not present

## 2018-09-23 DIAGNOSIS — J320 Chronic maxillary sinusitis: Secondary | ICD-10-CM | POA: Diagnosis not present

## 2018-09-23 DIAGNOSIS — J323 Chronic sphenoidal sinusitis: Secondary | ICD-10-CM | POA: Diagnosis not present

## 2018-10-28 DIAGNOSIS — N8111 Cystocele, midline: Secondary | ICD-10-CM | POA: Diagnosis not present

## 2018-11-07 DIAGNOSIS — M4807 Spinal stenosis, lumbosacral region: Secondary | ICD-10-CM | POA: Diagnosis not present

## 2018-11-07 DIAGNOSIS — G894 Chronic pain syndrome: Secondary | ICD-10-CM | POA: Diagnosis not present

## 2018-11-07 DIAGNOSIS — M4317 Spondylolisthesis, lumbosacral region: Secondary | ICD-10-CM | POA: Diagnosis not present

## 2018-11-07 DIAGNOSIS — M4727 Other spondylosis with radiculopathy, lumbosacral region: Secondary | ICD-10-CM | POA: Diagnosis not present

## 2018-11-21 DIAGNOSIS — M791 Myalgia, unspecified site: Secondary | ICD-10-CM | POA: Diagnosis not present

## 2018-11-21 DIAGNOSIS — G518 Other disorders of facial nerve: Secondary | ICD-10-CM | POA: Diagnosis not present

## 2018-11-21 DIAGNOSIS — M542 Cervicalgia: Secondary | ICD-10-CM | POA: Diagnosis not present

## 2018-11-21 DIAGNOSIS — R51 Headache: Secondary | ICD-10-CM | POA: Diagnosis not present

## 2018-12-07 DIAGNOSIS — I10 Essential (primary) hypertension: Secondary | ICD-10-CM | POA: Diagnosis not present

## 2018-12-07 DIAGNOSIS — N189 Chronic kidney disease, unspecified: Secondary | ICD-10-CM | POA: Diagnosis not present

## 2018-12-07 DIAGNOSIS — M48061 Spinal stenosis, lumbar region without neurogenic claudication: Secondary | ICD-10-CM | POA: Diagnosis not present

## 2018-12-07 DIAGNOSIS — M810 Age-related osteoporosis without current pathological fracture: Secondary | ICD-10-CM | POA: Diagnosis not present

## 2018-12-07 DIAGNOSIS — E039 Hypothyroidism, unspecified: Secondary | ICD-10-CM | POA: Diagnosis not present

## 2018-12-07 DIAGNOSIS — F419 Anxiety disorder, unspecified: Secondary | ICD-10-CM | POA: Diagnosis not present

## 2018-12-07 DIAGNOSIS — E871 Hypo-osmolality and hyponatremia: Secondary | ICD-10-CM | POA: Diagnosis not present

## 2018-12-07 DIAGNOSIS — J329 Chronic sinusitis, unspecified: Secondary | ICD-10-CM | POA: Diagnosis not present

## 2018-12-07 DIAGNOSIS — Z0000 Encounter for general adult medical examination without abnormal findings: Secondary | ICD-10-CM | POA: Diagnosis not present

## 2018-12-07 DIAGNOSIS — R8290 Unspecified abnormal findings in urine: Secondary | ICD-10-CM | POA: Diagnosis not present

## 2018-12-07 DIAGNOSIS — H6123 Impacted cerumen, bilateral: Secondary | ICD-10-CM | POA: Diagnosis not present

## 2019-01-08 DIAGNOSIS — H6092 Unspecified otitis externa, left ear: Secondary | ICD-10-CM | POA: Diagnosis not present

## 2019-01-20 ENCOUNTER — Other Ambulatory Visit: Payer: Self-pay | Admitting: Obstetrics

## 2019-01-20 DIAGNOSIS — M810 Age-related osteoporosis without current pathological fracture: Secondary | ICD-10-CM

## 2019-03-20 DIAGNOSIS — H6120 Impacted cerumen, unspecified ear: Secondary | ICD-10-CM | POA: Diagnosis not present

## 2019-03-20 DIAGNOSIS — J329 Chronic sinusitis, unspecified: Secondary | ICD-10-CM | POA: Diagnosis not present

## 2019-03-20 DIAGNOSIS — I10 Essential (primary) hypertension: Secondary | ICD-10-CM | POA: Diagnosis not present

## 2019-03-20 DIAGNOSIS — R8290 Unspecified abnormal findings in urine: Secondary | ICD-10-CM | POA: Diagnosis not present

## 2019-03-20 DIAGNOSIS — E039 Hypothyroidism, unspecified: Secondary | ICD-10-CM | POA: Diagnosis not present

## 2019-03-20 DIAGNOSIS — Z0000 Encounter for general adult medical examination without abnormal findings: Secondary | ICD-10-CM | POA: Diagnosis not present

## 2019-03-20 DIAGNOSIS — F419 Anxiety disorder, unspecified: Secondary | ICD-10-CM | POA: Diagnosis not present

## 2019-03-20 DIAGNOSIS — M48061 Spinal stenosis, lumbar region without neurogenic claudication: Secondary | ICD-10-CM | POA: Diagnosis not present

## 2019-03-20 DIAGNOSIS — M810 Age-related osteoporosis without current pathological fracture: Secondary | ICD-10-CM | POA: Diagnosis not present

## 2019-03-20 DIAGNOSIS — E871 Hypo-osmolality and hyponatremia: Secondary | ICD-10-CM | POA: Diagnosis not present

## 2019-03-20 DIAGNOSIS — N189 Chronic kidney disease, unspecified: Secondary | ICD-10-CM | POA: Diagnosis not present

## 2019-03-21 DIAGNOSIS — J324 Chronic pansinusitis: Secondary | ICD-10-CM | POA: Diagnosis not present

## 2019-04-04 DIAGNOSIS — J324 Chronic pansinusitis: Secondary | ICD-10-CM | POA: Diagnosis not present

## 2019-04-10 DIAGNOSIS — Z124 Encounter for screening for malignant neoplasm of cervix: Secondary | ICD-10-CM | POA: Diagnosis not present

## 2019-04-10 DIAGNOSIS — Z01419 Encounter for gynecological examination (general) (routine) without abnormal findings: Secondary | ICD-10-CM | POA: Diagnosis not present

## 2019-04-10 DIAGNOSIS — N8111 Cystocele, midline: Secondary | ICD-10-CM | POA: Diagnosis not present

## 2019-04-10 DIAGNOSIS — M810 Age-related osteoporosis without current pathological fracture: Secondary | ICD-10-CM | POA: Diagnosis not present

## 2019-04-25 DIAGNOSIS — J324 Chronic pansinusitis: Secondary | ICD-10-CM | POA: Diagnosis not present

## 2019-05-08 DIAGNOSIS — M4317 Spondylolisthesis, lumbosacral region: Secondary | ICD-10-CM | POA: Diagnosis not present

## 2019-05-08 DIAGNOSIS — M25512 Pain in left shoulder: Secondary | ICD-10-CM | POA: Diagnosis not present

## 2019-05-08 DIAGNOSIS — M4727 Other spondylosis with radiculopathy, lumbosacral region: Secondary | ICD-10-CM | POA: Diagnosis not present

## 2019-05-08 DIAGNOSIS — G894 Chronic pain syndrome: Secondary | ICD-10-CM | POA: Diagnosis not present

## 2019-05-09 ENCOUNTER — Other Ambulatory Visit: Payer: Self-pay

## 2019-05-09 ENCOUNTER — Ambulatory Visit
Admission: RE | Admit: 2019-05-09 | Discharge: 2019-05-09 | Disposition: A | Discharge status: Home / Self Care | Payer: Medicare Other | Source: Ambulatory Visit | Attending: Obstetrics | Admitting: Obstetrics

## 2019-05-09 DIAGNOSIS — M810 Age-related osteoporosis without current pathological fracture: Secondary | ICD-10-CM | POA: Diagnosis not present

## 2019-05-09 DIAGNOSIS — M85859 Other specified disorders of bone density and structure, unspecified thigh: Secondary | ICD-10-CM | POA: Diagnosis not present

## 2019-05-09 DIAGNOSIS — Z780 Asymptomatic menopausal state: Secondary | ICD-10-CM | POA: Diagnosis not present

## 2019-05-23 DIAGNOSIS — H2512 Age-related nuclear cataract, left eye: Secondary | ICD-10-CM | POA: Diagnosis not present

## 2019-05-23 DIAGNOSIS — H04123 Dry eye syndrome of bilateral lacrimal glands: Secondary | ICD-10-CM | POA: Diagnosis not present

## 2019-05-23 DIAGNOSIS — H25811 Combined forms of age-related cataract, right eye: Secondary | ICD-10-CM | POA: Diagnosis not present

## 2019-06-13 DIAGNOSIS — M1711 Unilateral primary osteoarthritis, right knee: Secondary | ICD-10-CM | POA: Diagnosis not present

## 2019-06-13 DIAGNOSIS — S83282A Other tear of lateral meniscus, current injury, left knee, initial encounter: Secondary | ICD-10-CM | POA: Diagnosis not present

## 2019-06-13 DIAGNOSIS — M1712 Unilateral primary osteoarthritis, left knee: Secondary | ICD-10-CM | POA: Diagnosis not present

## 2019-06-15 DIAGNOSIS — F419 Anxiety disorder, unspecified: Secondary | ICD-10-CM | POA: Diagnosis not present

## 2019-07-21 DIAGNOSIS — M5412 Radiculopathy, cervical region: Secondary | ICD-10-CM | POA: Diagnosis not present

## 2019-07-31 DIAGNOSIS — Z1231 Encounter for screening mammogram for malignant neoplasm of breast: Secondary | ICD-10-CM | POA: Diagnosis not present

## 2019-07-31 DIAGNOSIS — N8111 Cystocele, midline: Secondary | ICD-10-CM | POA: Diagnosis not present

## 2019-07-31 DIAGNOSIS — N3941 Urge incontinence: Secondary | ICD-10-CM | POA: Diagnosis not present

## 2019-09-09 DIAGNOSIS — M545 Low back pain: Secondary | ICD-10-CM | POA: Diagnosis not present

## 2019-09-09 DIAGNOSIS — M79672 Pain in left foot: Secondary | ICD-10-CM | POA: Diagnosis not present

## 2019-10-18 DIAGNOSIS — N3941 Urge incontinence: Secondary | ICD-10-CM | POA: Diagnosis not present

## 2019-10-18 DIAGNOSIS — N8111 Cystocele, midline: Secondary | ICD-10-CM | POA: Diagnosis not present

## 2019-10-30 ENCOUNTER — Ambulatory Visit: Payer: Medicare Other | Attending: Internal Medicine

## 2019-10-30 DIAGNOSIS — Z23 Encounter for immunization: Secondary | ICD-10-CM | POA: Insufficient documentation

## 2019-10-30 NOTE — Progress Notes (Signed)
   Covid-19 Vaccination Clinic  Name:  Denise Hoffman    MRN: [id number] DOB: Nov 11, 1935  10/30/2019  Denise Hoffman was observed post Covid-19 immunization for 15 minutes without incidence. She was provided with Vaccine Information Sheet and instruction to access the V-Safe system.   Denise Hoffman was instructed to call 911 with any severe reactions post vaccine: Marland Kitchen Difficulty breathing  . Swelling of your face and throat  . A fast heartbeat  . A bad rash all over your body  . Dizziness and weakness    Immunizations Administered    Name Date Dose VIS Date Route   Pfizer COVID-19 Vaccine 10/30/2019  8:55 AM 0.3 mL 09/15/2019 Intramuscular   Manufacturer: [place]   Lot: [id number]   [place]: [id number]

## 2019-11-20 ENCOUNTER — Ambulatory Visit: Payer: Medicare Other | Attending: Internal Medicine

## 2019-11-20 DIAGNOSIS — Z23 Encounter for immunization: Secondary | ICD-10-CM | POA: Insufficient documentation

## 2019-11-20 NOTE — Progress Notes (Signed)
   Covid-19 Vaccination Clinic  Name:  Denise Hoffman    MRN: [id number] DOB: [birth year]  11/20/2019  [name] was observed post Covid-19 immunization for 15 minutes without incidence. She was provided with Vaccine Information Sheet and instruction to access the V-Safe system.   [name] was instructed to call 911 with any severe reactions post vaccine: Marland Kitchen Difficulty breathing  . Swelling of your face and throat  . A fast heartbeat  . A bad rash all over your body  . Dizziness and weakness    Immunizations Administered    Name Date Dose VIS Date Route   Pfizer COVID-19 Vaccine 11/20/2019  9:35 AM 0.3 mL 09/15/2019 Intramuscular   Manufacturer: [place]   Lot: [id number]   [place]: [id number]

## 2020-01-17 DIAGNOSIS — N8111 Cystocele, midline: Secondary | ICD-10-CM | POA: Diagnosis not present

## 2020-01-23 DIAGNOSIS — H9203 Otalgia, bilateral: Secondary | ICD-10-CM | POA: Diagnosis not present

## 2020-02-01 DIAGNOSIS — I10 Essential (primary) hypertension: Secondary | ICD-10-CM | POA: Diagnosis not present

## 2020-02-07 DIAGNOSIS — R8290 Unspecified abnormal findings in urine: Secondary | ICD-10-CM | POA: Diagnosis not present

## 2020-02-07 DIAGNOSIS — N289 Disorder of kidney and ureter, unspecified: Secondary | ICD-10-CM | POA: Diagnosis not present

## 2020-02-07 DIAGNOSIS — I10 Essential (primary) hypertension: Secondary | ICD-10-CM | POA: Diagnosis not present

## 2020-02-07 DIAGNOSIS — Z8739 Personal history of other diseases of the musculoskeletal system and connective tissue: Secondary | ICD-10-CM | POA: Diagnosis not present

## 2020-02-07 DIAGNOSIS — J310 Chronic rhinitis: Secondary | ICD-10-CM | POA: Diagnosis not present

## 2020-02-07 DIAGNOSIS — K589 Irritable bowel syndrome without diarrhea: Secondary | ICD-10-CM | POA: Diagnosis not present

## 2020-02-07 DIAGNOSIS — N189 Chronic kidney disease, unspecified: Secondary | ICD-10-CM | POA: Diagnosis not present

## 2020-02-07 DIAGNOSIS — L989 Disorder of the skin and subcutaneous tissue, unspecified: Secondary | ICD-10-CM | POA: Diagnosis not present

## 2020-02-07 DIAGNOSIS — F419 Anxiety disorder, unspecified: Secondary | ICD-10-CM | POA: Diagnosis not present

## 2020-02-07 DIAGNOSIS — E039 Hypothyroidism, unspecified: Secondary | ICD-10-CM | POA: Diagnosis not present

## 2020-02-07 DIAGNOSIS — Z0000 Encounter for general adult medical examination without abnormal findings: Secondary | ICD-10-CM | POA: Diagnosis not present

## 2020-02-09 DIAGNOSIS — N289 Disorder of kidney and ureter, unspecified: Secondary | ICD-10-CM | POA: Diagnosis not present

## 2020-02-09 DIAGNOSIS — K589 Irritable bowel syndrome without diarrhea: Secondary | ICD-10-CM | POA: Diagnosis not present

## 2020-02-09 DIAGNOSIS — F419 Anxiety disorder, unspecified: Secondary | ICD-10-CM | POA: Diagnosis not present

## 2020-02-09 DIAGNOSIS — Z8739 Personal history of other diseases of the musculoskeletal system and connective tissue: Secondary | ICD-10-CM | POA: Diagnosis not present

## 2020-02-09 DIAGNOSIS — J310 Chronic rhinitis: Secondary | ICD-10-CM | POA: Diagnosis not present

## 2020-02-09 DIAGNOSIS — Z0000 Encounter for general adult medical examination without abnormal findings: Secondary | ICD-10-CM | POA: Diagnosis not present

## 2020-02-09 DIAGNOSIS — N189 Chronic kidney disease, unspecified: Secondary | ICD-10-CM | POA: Diagnosis not present

## 2020-02-09 DIAGNOSIS — E039 Hypothyroidism, unspecified: Secondary | ICD-10-CM | POA: Diagnosis not present

## 2020-02-09 DIAGNOSIS — R8290 Unspecified abnormal findings in urine: Secondary | ICD-10-CM | POA: Diagnosis not present

## 2020-02-09 DIAGNOSIS — L989 Disorder of the skin and subcutaneous tissue, unspecified: Secondary | ICD-10-CM | POA: Diagnosis not present

## 2020-02-09 DIAGNOSIS — I10 Essential (primary) hypertension: Secondary | ICD-10-CM | POA: Diagnosis not present

## 2020-02-22 DIAGNOSIS — H61001 Unspecified perichondritis of right external ear: Secondary | ICD-10-CM | POA: Diagnosis not present

## 2020-02-27 DIAGNOSIS — K5900 Constipation, unspecified: Secondary | ICD-10-CM | POA: Diagnosis not present

## 2020-04-24 DIAGNOSIS — M1712 Unilateral primary osteoarthritis, left knee: Secondary | ICD-10-CM | POA: Diagnosis not present

## 2020-04-25 DIAGNOSIS — H2512 Age-related nuclear cataract, left eye: Secondary | ICD-10-CM | POA: Diagnosis not present

## 2020-04-25 DIAGNOSIS — H25811 Combined forms of age-related cataract, right eye: Secondary | ICD-10-CM | POA: Diagnosis not present

## 2020-04-25 DIAGNOSIS — H04123 Dry eye syndrome of bilateral lacrimal glands: Secondary | ICD-10-CM | POA: Diagnosis not present

## 2020-04-26 DIAGNOSIS — R52 Pain, unspecified: Secondary | ICD-10-CM | POA: Diagnosis not present

## 2020-04-26 DIAGNOSIS — I10 Essential (primary) hypertension: Secondary | ICD-10-CM | POA: Diagnosis not present

## 2020-04-26 DIAGNOSIS — M5489 Other dorsalgia: Secondary | ICD-10-CM | POA: Diagnosis not present

## 2020-05-02 DIAGNOSIS — R079 Chest pain, unspecified: Secondary | ICD-10-CM | POA: Diagnosis not present

## 2020-05-03 ENCOUNTER — Ambulatory Visit (INDEPENDENT_AMBULATORY_CARE_PROVIDER_SITE_OTHER): Payer: Medicare Other | Admitting: Cardiovascular Disease

## 2020-05-03 ENCOUNTER — Encounter: Payer: Self-pay | Admitting: Cardiovascular Disease

## 2020-05-03 ENCOUNTER — Other Ambulatory Visit: Payer: Self-pay

## 2020-05-03 VITALS — BP 135/58 | HR 66 | Ht 62.0 in | Wt 134.4 lb

## 2020-05-03 DIAGNOSIS — R9431 Abnormal electrocardiogram [ECG] [EKG]: Secondary | ICD-10-CM

## 2020-05-03 DIAGNOSIS — R0789 Other chest pain: Secondary | ICD-10-CM

## 2020-05-03 MED ORDER — OMEPRAZOLE 20 MG PO CPDR: 20.0000 mg | DELAYED_RELEASE_CAPSULE | Freq: Every day | ORAL | 2 refills | Status: DC

## 2020-05-03 NOTE — Patient Instructions (Signed)
Medication Instructions:  Start Prilosec 20 mg daily   *If you need a refill on your cardiac medications before your next appointment, please call your pharmacy*   Testing/Procedures: Echocardiogram - Your physician has requested that you have an echocardiogram. Echocardiography is a painless test that uses sound waves to create images of your heart. It provides your doctor with information about the size and shape of your heart and how well your heart's chambers and valves are working. This procedure takes approximately one hour. There are no restrictions for this procedure. This will be performed at our [facility] location - [address].    Follow-Up: At [facility], you and your health needs are our priority.  As part of our continuing mission to provide you with exceptional heart care, we have created designated Provider Care Teams.  These Care Teams include your primary Cardiologist (physician) and Advanced Practice Providers (APPs -  Physician Assistants and Nurse Practitioners) who all work together to provide you with the care you need, when you need it.  We recommend signing up for the patient portal called "MyChart".  Sign up information is provided on this After Visit Summary.  MyChart is used to connect with patients for Virtual Visits (Telemedicine).  Patients are able to view lab/test results, encounter notes, upcoming appointments, etc.  Non-urgent messages can be sent to your provider as well.   To learn more about what you can do with MyChart, go to [email or web address].    Your next appointment:   6 month(s)  The format for your next appointment:   In Person  Provider:   Eleonore Chiquito, MD

## 2020-05-03 NOTE — Progress Notes (Signed)
Cardiology Office Note:   Date:  05/03/2020  NAME:  Denise Hoffman    MRN: [id number] DOB:  [birth year]   PCP:  Hulan Fess, MD  Cardiologist:  No primary care provider on file.  Electrophysiologist:  None   Referring MD: Hulan Fess, MD   Chief Complaint  Patient presents with  . Chest Pain   History of Present Illness:   Denise Hoffman is a 84 y.o. female with a hx of HTN, hypothyroidism, arthritis, osteoporosis who is being seen today for the evaluation of chest pain at the request of Little, Lennette Bihari, MD.  She reports last Friday she had a one-time episode of back pain.  She reports she had intense left back pain after talking to her daughter.  She was on the phone and began to have an intense sharp achy pain in her back.  The pain radiating to her left chest.  She reports no aggravating factors.  She reports no identifiable trigger.  She reports the pain went away after 15 minutes.  She did go to the fire station and an EKG.  She did not follow-up with her primary care physician who referred her to cardiology.  She reports she does have osteoporosis and a bad back.  They have recommended back surgery but she is hesitant to do this.  She also has hypothyroidism that is well controlled.  She is never had a heart attack or stroke.  Her most recent cholesterol profile from her primary care physician shows a total cholesterol of 129, HDL 48, LDL 64, triglycerides 84.  She is not anemic.  Kidney function is normal.  She reports that she does not exercise routinely but has no limitations with walking or climbing a flight of stairs.  She gets no chest pain or trouble breathing with her current level of activity.  This pain was rather out of the blue for her.  It strictly occurred at rest.  She reports that she does have acid reflux but does not take medication for it.  Apparently she has no sensation of smell or taste so she is actually modified her diet.  She is a never smoker.  She does  not consume alcohol or use drugs.  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  She is originally from [place]  Her husband was in the service and then she relocated to the [place].  She has no history of diabetes.  Cholesterol mentioned above.  There is no strong family history of heart disease.  Her EKG today demonstrates normal sinus rhythm, heart rate 66 with first-degree block and old anteroseptal infarct.  She is had no further episodes of chest pain.  This appears to been a one-time episode.  Past Medical History: Past Medical History:  Diagnosis Date  . Hernia   . Hypertension   . Pancreatic cyst   . Sciatica     Past Surgical History: Past Surgical History:  Procedure Laterality Date  . COLONOSCOPY  01/30/2012   Procedure: COLONOSCOPY;  Surgeon: Inda Castle, MD;  Location: [facility];  Service: Endoscopy;  Laterality: N/A;  . ESOPHAGOGASTRODUODENOSCOPY  01/28/2012   Procedure: ESOPHAGOGASTRODUODENOSCOPY (EGD);  Surgeon: Inda Castle, MD;  Location: [facility];  Service: Endoscopy;  Laterality: N/A;  . HERNIA REPAIR     lvh with 12 cm parietex  . WHIPPLE PROCEDURE      Current Medications: Current Meds  Medication Sig  . alendronate (FOSAMAX) 70 MG tablet Take 70 mg  by mouth once a week.  . ALPRAZolam (XANAX) 0.5 MG tablet Take 0.5 mg by mouth at bedtime as needed.  Marland Kitchen amLODipine (NORVASC) 2.5 MG tablet Take 2.5 mg by mouth daily.  Marland Kitchen aspirin 81 MG tablet Take 1 tablet (81 mg total) by mouth daily. -HOLD FOR ONE WEEK; THEN RESUMED 81MG  ENTERIC COATED DAILY ASPIRIN.  Marland Kitchen CALCIUM CITRATE-VITAMIN D PO Take 1 tablet by mouth daily. Taking 1200 mg daily  . estradiol (ESTRACE) 0.1 MG/GM vaginal cream Place 2 g vaginally 2 (two) times a week.   . lidocaine (LIDODERM) 5 % Place 1 patch onto the skin daily. Remove & Discard patch within 12 hours or as directed by MD  . losartan (COZAAR) 100 MG tablet Take 100 mg by mouth daily.   . metoprolol (LOPRESSOR) 100 MG  tablet Take 0.5 tablets (50 mg total) by mouth 2 (two) times daily. (Patient taking differently: Take 100 mg by mouth 2 (two) times daily. )  . Multiple Vitamin (MULTIVITAMIN) capsule Take 1 capsule by mouth daily.  Marland Kitchen TRIMO-SAN 0.025-0.01 % GEL SMARTSIG:1 Applicator Vaginal Twice a Week  . Zinc 50 MG CAPS Take 50 mg by mouth daily.     Allergies:    Amoxicillin-pot clavulanate, Biaxin [clarithromycin], Linzess [linaclotide], Morphine, Prednisone, Sulfonamide derivatives, and Vioxx [rofecoxib]   Social History: Social History   Socioeconomic History  . Marital status: Married    Spouse name: Not on file  . Number of children: 2  . Years of education: Not on file  . Highest education level: Not on file  Occupational History  . Occupation: Retired    Fish farm manager: RETIRED  Tobacco Use  . Smoking status: Never Smoker  . Smokeless tobacco: Never Used  Substance and Sexual Activity  . Alcohol use: No  . Drug use: No  . Sexual activity: Not on file  Other Topics Concern  . Not on file  Social History Narrative  . Not on file   Social Determinants of Health   Financial Resource Strain:   . Difficulty of Paying Living Expenses:   Food Insecurity:   . Worried About Charity fundraiser in the Last Year:   . Arboriculturist in the Last Year:   Transportation Needs:   . Film/video editor (Medical):   Marland Kitchen Lack of Transportation (Non-Medical):   Physical Activity:   . Days of Exercise per Week:   . Minutes of Exercise per Session:   Stress:   . Feeling of Stress :   Social Connections:   . Frequency of Communication with Friends and Family:   . Frequency of Social Gatherings with Friends and Family:   . Attends Religious Services:   . Active Member of Clubs or Organizations:   . Attends Archivist Meetings:   Marland Kitchen Marital Status:      Family History: The patient's family history includes Breast cancer in her sister; Colon cancer in her brother; Stroke in her  mother.  ROS:   All other ROS reviewed and negative. Pertinent positives noted in the HPI.     EKGs/Labs/Other Studies Reviewed:   The following studies were personally reviewed by me today:  EKG:  EKG is ordered today.  The ekg ordered today demonstrates heart rate 66, first-degree block, old anteroseptal infarct, and was personally reviewed by me.   Recent Labs: No results found for requested labs within last 8760 hours.   Recent Lipid Panel No results found for: CHOL, TRIG, HDL, CHOLHDL, VLDL, LDLCALC,  LDLDIRECT  Physical Exam:   VS:  BP (!) 135/58   Pulse 66   Ht 5\' 2"  (1.575 m)   Wt 134 lb 6.4 oz (61 kg)   SpO2 98%   BMI 24.58 kg/m    Wt Readings from Last 3 Encounters:  05/03/20 134 lb 6.4 oz (61 kg)  05/01/13 141 lb 6.4 oz (64.1 kg)  03/04/12 139 lb 9.6 oz (63.3 kg)    General: Well nourished, well developed, in no acute distress Heart: Atraumatic, normal size  Eyes: PEERLA, EOMI  Neck: Supple, no JVD Endocrine: No thryomegaly Cardiac: Normal S1, S2; RRR; no murmurs, rubs, or gallops Lungs: Clear to auscultation bilaterally, no wheezing, rhonchi or rales  Abd: Soft, nontender, no hepatomegaly  Ext: No edema, pulses 2+ Musculoskeletal: No deformities, BUE and BLE strength normal and equal Skin: Warm and dry, no rashes   Neuro: Alert and oriented to person, place, time, and situation, CNII-XII grossly intact, no focal deficits  Psych: Normal mood and affect   ASSESSMENT:   JANEY PETRON is a 84 y.o. female who presents for the following: 1. Other chest pain   2. Nonspecific abnormal electrocardiogram (ECG) (EKG)     PLAN:   1. Other chest pain 2. Nonspecific abnormal electrocardiogram (ECG) (EKG) -She presents with a one-time episode of sharp back pain that radiated into the left chest.  Symptoms lasted 15 minutes.  She had no further episodes.  The symptoms were not worse with exertion.  Her EKG demonstrates normal sinus rhythm with an old anteroseptal  infarct.  I highly suspect this is an EKG abnormality related to hypertension.  She has no limitations with chest pain or shortness of breath with climbing a flight of stairs or walking.  This chest pain is extremely atypical for cardiac pain.  Her main CVD risk factor is hypertension.  Her most recent LDL cholesterol is 64.  She does have acid reflux and I suspect her symptoms are either acid reflux related or related to arthritis.  She has known arthritis in the lumbar spine.  I recommended she start Prilosec 20 mg daily.  I would also like for her to get an echocardiogram due to her abnormal EKG.  I will plan to see her back in 6 months and we can reassess symptoms.  If she has further symptoms we can consider a coronary CTA.  Right now, given the one-time episode that is resolved, I think we can wait on further testing for now.  Disposition: Return in about 6 months (around 11/03/2020).  Medication Adjustments/Labs and Tests Ordered: Current medicines are reviewed at length with the patient today.  Concerns regarding medicines are outlined above.  Orders Placed This Encounter  Procedures  . EKG 12-Lead  . ECHOCARDIOGRAM COMPLETE   Meds ordered this encounter  Medications  . omeprazole (PRILOSEC) 20 MG capsule    Sig: Take 1 capsule (20 mg total) by mouth daily.    Dispense:  90 capsule    Refill:  2    Patient Instructions  Medication Instructions:  Start Prilosec 20 mg daily   *If you need a refill on your cardiac medications before your next appointment, please call your pharmacy*   Testing/Procedures: Echocardiogram - Your physician has requested that you have an echocardiogram. Echocardiography is a painless test that uses sound waves to create images of your heart. It provides your doctor with information about the size and shape of your heart and how well your heart's chambers and valves  are working. This procedure takes approximately one hour. There are no restrictions for this  procedure. This will be performed at our [facility] location - [address], [address].    Follow-Up: At [facility], you and your health needs are our priority.  As part of our continuing mission to provide you with exceptional heart care, we have created designated Provider Care Teams.  These Care Teams include your primary Cardiologist (physician) and Advanced Practice Providers (APPs -  Physician Assistants and Nurse Practitioners) who all work together to provide you with the care you need, when you need it.  We recommend signing up for the patient portal called "MyChart".  Sign up information is provided on this After Visit Summary.  MyChart is used to connect with patients for Virtual Visits (Telemedicine).  Patients are able to view lab/test results, encounter notes, upcoming appointments, etc.  Non-urgent messages can be sent to your provider as well.   To learn more about what you can do with MyChart, go to [email or web address].    Your next appointment:   6 month(s)  The format for your next appointment:   In Person  Provider:   Eleonore Chiquito, MD          Signed, Addison Naegeli. Audie Box, [place]  [address], [place], [place] [address] [phone]  05/03/2020 3:21 PM

## 2020-05-09 ENCOUNTER — Telehealth: Payer: Self-pay | Admitting: Cardiovascular Disease

## 2020-05-09 NOTE — Telephone Encounter (Signed)
Spoke with pt, she has stopped the prilosec and the symptoms have gotten better. She is going to buy some OTC meds, prevacid or nexium to see if she can tolerate one of these medications. She will call for a prescription if she finds one she can tolerate.

## 2020-05-09 NOTE — Telephone Encounter (Signed)
Pt c/o medication issue:  1. Name of Medication: omeprazole (PRILOSEC) 20 MG capsule  2. How are you currently taking this medication (dosage and times per day)? 1 tablet daily  3. Are you having a reaction (difficulty breathing--STAT)? no  4. What is your medication issue? Patient states she started taking the medication on Sunday. She states on Tuesday she started having belly pain and diarrhea through Wednesday. Please advise.

## 2020-05-24 ENCOUNTER — Ambulatory Visit (HOSPITAL_COMMUNITY): Payer: Medicare Other | Attending: Cardiology

## 2020-05-24 ENCOUNTER — Other Ambulatory Visit: Payer: Self-pay

## 2020-05-24 DIAGNOSIS — R0789 Other chest pain: Secondary | ICD-10-CM | POA: Diagnosis not present

## 2020-05-24 LAB — ECHOCARDIOGRAM COMPLETE
Area-P 1/2: 2.48 cm2
P 1/2 time: 545 msec
S' Lateral: 3 cm

## 2020-05-29 ENCOUNTER — Telehealth: Payer: Self-pay | Admitting: Cardiovascular Disease

## 2020-05-29 NOTE — Telephone Encounter (Signed)
Called patient back, gave results. Patient verbalized understanding.

## 2020-05-29 NOTE — Telephone Encounter (Signed)
Follow Up: ° ° ° °Returning your call,concerning her Echo results.  °

## 2020-07-14 DIAGNOSIS — Z03818 Encounter for observation for suspected exposure to other biological agents ruled out: Secondary | ICD-10-CM | POA: Diagnosis not present

## 2020-07-14 DIAGNOSIS — H66001 Acute suppurative otitis media without spontaneous rupture of ear drum, right ear: Secondary | ICD-10-CM | POA: Diagnosis not present

## 2020-07-14 DIAGNOSIS — H9201 Otalgia, right ear: Secondary | ICD-10-CM | POA: Diagnosis not present

## 2020-07-14 DIAGNOSIS — H6123 Impacted cerumen, bilateral: Secondary | ICD-10-CM | POA: Diagnosis not present

## 2020-07-14 DIAGNOSIS — R059 Cough, unspecified: Secondary | ICD-10-CM | POA: Diagnosis not present

## 2020-07-17 DIAGNOSIS — J0101 Acute recurrent maxillary sinusitis: Secondary | ICD-10-CM | POA: Diagnosis not present

## 2020-07-23 DIAGNOSIS — L853 Xerosis cutis: Secondary | ICD-10-CM | POA: Diagnosis not present

## 2020-07-23 DIAGNOSIS — H6122 Impacted cerumen, left ear: Secondary | ICD-10-CM | POA: Diagnosis not present

## 2020-07-23 DIAGNOSIS — H9203 Otalgia, bilateral: Secondary | ICD-10-CM | POA: Diagnosis not present

## 2020-07-26 DIAGNOSIS — H60333 Swimmer's ear, bilateral: Secondary | ICD-10-CM | POA: Diagnosis not present

## 2020-07-31 DIAGNOSIS — B373 Candidiasis of vulva and vagina: Secondary | ICD-10-CM | POA: Diagnosis not present

## 2020-07-31 DIAGNOSIS — Z4689 Encounter for fitting and adjustment of other specified devices: Secondary | ICD-10-CM | POA: Diagnosis not present

## 2020-07-31 DIAGNOSIS — N8111 Cystocele, midline: Secondary | ICD-10-CM | POA: Diagnosis not present

## 2020-08-14 DIAGNOSIS — Z23 Encounter for immunization: Secondary | ICD-10-CM | POA: Diagnosis not present

## 2020-10-05 HISTORY — PX: CATARACT EXTRACTION: SUR2

## 2020-10-22 ENCOUNTER — Other Ambulatory Visit: Payer: Self-pay | Admitting: Obstetrics

## 2020-10-22 DIAGNOSIS — M810 Age-related osteoporosis without current pathological fracture: Secondary | ICD-10-CM

## 2020-10-31 NOTE — Progress Notes (Signed)
Cardiology Office Note:   Date:  11/01/2020  NAME:  Denise Hoffman    MRN: [id number] DOB:  04/24/1936   PCP:  Hulan Fess, MD  Cardiologist:  No primary care provider on file.  Electrophysiologist:  None   Referring MD: Hulan Fess, MD   Chief Complaint  Patient presents with  . Chest Pain   History of Present Illness:   Denise Hoffman is a 85 y.o. female with a hx of HTN and arthritis who was evaluated last summer for back pain that radiated into chest. One time episode felt to be GERD or arthritis related. Started on PPI. EKG showed old anteroseptal infarct. Echo normal. She reports has had 2 episodes of back and chest pain in the last 6 months.  Symptoms occur at rest.  She reports it starts as pressure in her back and radiates into the center of her chest.  Symptoms last 10 to 20 seconds and resolve without intervention.  Not associated with food.  Not associated with exertion.  Not alleviated by rest.  She reports no alleviating factors.  She also suffers from lumbar stenosis.  She has some numbness and tingling in her legs and will see a spinal surgeon.  She reports no issues with activities such as exertion.  No exertional chest pressure or pain.  Blood pressure has been a bit elevated lately.  158/82.  She had values up in the 180s recently.  She is on losartan, metoprolol and amlodipine.  I recommended to increase her amlodipine to 5 mg a day.  She does take an aspirin.  She should stop this.  She is a never smoker.  CVD risk factor include hypertension and age.  We discussed proceeding with stress testing and calcium scoring.  She is okay to proceed.  Problem List 1. HTN 2. Hypothyroidism 3. Arthritis  4. T chol 129, HDL 48, LDL 64, TG 84 5.  Lumbar spinal stenosis  Past Medical History: Past Medical History:  Diagnosis Date  . Hernia   . Hypertension   . Pancreatic cyst   . Sciatica     Past Surgical History: Past Surgical History:  Procedure Laterality  Date  . COLONOSCOPY  01/30/2012   Procedure: COLONOSCOPY;  Surgeon: Inda Castle, MD;  Location: [facility];  Service: Endoscopy;  Laterality: N/A;  . ESOPHAGOGASTRODUODENOSCOPY  01/28/2012   Procedure: ESOPHAGOGASTRODUODENOSCOPY (EGD);  Surgeon: Inda Castle, MD;  Location: [facility];  Service: Endoscopy;  Laterality: N/A;  . HERNIA REPAIR     lvh with 12 cm parietex  . WHIPPLE PROCEDURE      Current Medications: Current Meds  Medication Sig  . ALPRAZolam (XANAX) 0.5 MG tablet Take 0.5 mg by mouth at bedtime as needed.  Marland Kitchen CALCIUM CITRATE-VITAMIN D PO Take 1 tablet by mouth daily. Taking 1200 mg daily  . estradiol (ESTRACE) 0.1 MG/GM vaginal cream Place 2 g vaginally 2 (two) times a week.   . levothyroxine (SYNTHROID) 50 MCG tablet Take 50 mcg by mouth daily.  Marland Kitchen lidocaine (LIDODERM) 5 % Place 1 patch onto the skin daily. Remove & Discard patch within 12 hours or as directed by MD  . losartan (COZAAR) 100 MG tablet Take 100 mg by mouth daily.   . metoprolol (LOPRESSOR) 100 MG tablet Take 0.5 tablets (50 mg total) by mouth 2 (two) times daily. (Patient taking differently: Take 100 mg by mouth 2 (two) times daily.)  . Multiple Vitamin (MULTIVITAMIN) capsule Take 1 capsule by mouth daily.  Marland Kitchen  TRIMO-SAN 0.025-0.01 % GEL SMARTSIG:1 Applicator Vaginal Twice a Week  . Zinc 50 MG CAPS Take 50 mg by mouth daily.  . [DISCONTINUED] alendronate (FOSAMAX) 70 MG tablet Take 70 mg by mouth once a week.  . [DISCONTINUED] amLODipine (NORVASC) 2.5 MG tablet Take 2.5 mg by mouth daily.  . [DISCONTINUED] aspirin 81 MG tablet Take 1 tablet (81 mg total) by mouth daily. -HOLD FOR ONE WEEK; THEN RESUMED 81MG  ENTERIC COATED DAILY ASPIRIN.  . [DISCONTINUED] omeprazole (PRILOSEC) 20 MG capsule Take 1 capsule (20 mg total) by mouth daily.     Allergies:    Amoxicillin-pot clavulanate, Biaxin [clarithromycin], Linzess [linaclotide], Morphine, Prednisone, Sulfonamide derivatives, and Vioxx [rofecoxib]    Social History: Social History   Socioeconomic History  . Marital status: Married    Spouse name: Not on file  . Number of children: 2  . Years of education: Not on file  . Highest education level: Not on file  Occupational History  . Occupation: Retired    Fish farm manager: RETIRED  Tobacco Use  . Smoking status: Never Smoker  . Smokeless tobacco: Never Used  Substance and Sexual Activity  . Alcohol use: No  . Drug use: No  . Sexual activity: Not on file  Other Topics Concern  . Not on file  Social History Narrative  . Not on file   Social Determinants of Health   Financial Resource Strain: Not on file  Food Insecurity: Not on file  Transportation Needs: Not on file  Physical Activity: Not on file  Stress: Not on file  Social Connections: Not on file     Family History: The patient's family history includes Breast cancer in her sister; Colon cancer in her brother; Stroke in her mother.  ROS:   All other ROS reviewed and negative. Pertinent positives noted in the HPI.     EKGs/Labs/Other Studies Reviewed:   The following studies were personally reviewed by me today:   TTE 05/24/2020 1. Left ventricular ejection fraction, by estimation, is 60 to 65%. The  left ventricle has normal function. The left ventricle has no regional  wall motion abnormalities. Left ventricular diastolic parameters are  consistent with Grade I diastolic  dysfunction (impaired relaxation).  2. Right ventricular systolic function is normal. The right ventricular  size is normal. Tricuspid regurgitation signal is inadequate for assessing  PA pressure.  3. The mitral valve is normal in structure. Mild mitral valve  regurgitation. No evidence of mitral stenosis.  4. The aortic valve is tricuspid. Aortic valve regurgitation is mild.  Mild aortic valve sclerosis is present, with no evidence of aortic valve  stenosis.  5. The inferior vena cava is normal in size with greater than 50%   respiratory variability, suggesting right atrial pressure of 3 mmHg.   Recent Labs: No results found for requested labs within last 8760 hours.   Recent Lipid Panel No results found for: CHOL, TRIG, HDL, CHOLHDL, VLDL, LDLCALC, LDLDIRECT  Physical Exam:   VS:  BP (!) 152/82   Pulse 70   Ht 5\' 2"  (1.575 m)   Wt 137 lb (62.1 kg)   SpO2 100%   BMI 25.06 kg/m    Wt Readings from Last 3 Encounters:  11/01/20 137 lb (62.1 kg)  05/03/20 134 lb 6.4 oz (61 kg)  05/01/13 141 lb 6.4 oz (64.1 kg)    General: Well nourished, well developed, in no acute distress Head: Atraumatic, normal size  Eyes: PEERLA, EOMI  Neck: Supple, no JVD Endocrine: No  thryomegaly Cardiac: Normal S1, S2; RRR; no murmurs, rubs, or gallops Lungs: Clear to auscultation bilaterally, no wheezing, rhonchi or rales  Abd: Soft, nontender, no hepatomegaly  Ext: No edema, pulses 2+ Musculoskeletal: No deformities, BUE and BLE strength normal and equal Skin: Warm and dry, no rashes   Neuro: Alert and oriented to person, place, time, and situation, CNII-XII grossly intact, no focal deficits  Psych: Normal mood and affect   ASSESSMENT:   Denise Hoffman is a 85 y.o. female who presents for the following: 1. Other chest pain   2. Nonspecific abnormal electrocardiogram (ECG) (EKG)   3. Primary hypertension     PLAN:   1. Other chest pain -She continues to have atypical chest pain.  Echocardiogram shows normal LV function.  EKG showed an old anteroseptal infarct which is likely related to blood pressure.  Symptoms are not exertional.  They are really not concerning for typical angina.  Given that she still having symptoms I recommended calcium scoring as well as a Lexi nuclear medicine stress test.  She is okay to proceed with this.  We will notify her of the results by phone.  If she needs to see if she will. -No strong indication for aspirin.  She will hold this until we find the results of her stress test and  calcium scoring.  2. Nonspecific abnormal electrocardiogram (ECG) (EKG) -Old anteroseptal infarct.  EKG was artifact.  Echocardiogram shows normal LV function.  3. Primary hypertension -Increase Norvasc to 5 mg daily.  Shared Decision Making/Informed Consent The risks [chest pain, shortness of breath, cardiac arrhythmias, dizziness, blood pressure fluctuations, myocardial infarction, stroke/transient ischemic attack, nausea, vomiting, allergic reaction, radiation exposure, metallic taste sensation and life-threatening complications (estimated to be 1 in 10,000)], benefits (risk stratification, diagnosing coronary artery disease, treatment guidance) and alternatives of a nuclear stress test were discussed in detail with Ms. Brietzke and she agrees to proceed.  Disposition: Return if symptoms worsen or fail to improve.  Medication Adjustments/Labs and Tests Ordered: Current medicines are reviewed at length with the patient today.  Concerns regarding medicines are outlined above.  Orders Placed This Encounter  Procedures  . CT CARDIAC SCORING (SELF PAY ONLY)  . MYOCARDIAL PERFUSION IMAGING   Meds ordered this encounter  Medications  . amLODipine (NORVASC) 5 MG tablet    Sig: Take 1 tablet (5 mg total) by mouth daily.    Dispense:  90 tablet    Refill:  1    Patient Instructions  Medication Instructions:  Stop Aspirin Increase Amlodipine 5 mg daily   *If you need a refill on your cardiac medications before your next appointment, please call your pharmacy*  Testing/Procedures:  Your physician has requested that you have a lexiscan myoview. A cardiac stress test is a cardiological test that measures the heart's ability to respond to external stress in a controlled clinical environment. The stress response is induced by intravenous pharmacological stimulation.   CALCIUM SCORE   Follow-Up: At [facility], you and your health needs are our priority.  As part of our continuing  mission to provide you with exceptional heart care, we have created designated Provider Care Teams.  These Care Teams include your primary Cardiologist (physician) and Advanced Practice Providers (APPs -  Physician Assistants and Nurse Practitioners) who all work together to provide you with the care you need, when you need it.  We recommend signing up for the patient portal called "MyChart".  Sign up information is provided on this After  Visit Summary.  MyChart is used to connect with patients for Virtual Visits (Telemedicine).  Patients are able to view lab/test results, encounter notes, upcoming appointments, etc.  Non-urgent messages can be sent to your provider as well.   To learn more about what you can do with MyChart, go to [email or web address].    Your next appointment:   As needed  The format for your next appointment:   In Person  Provider:   Eleonore Chiquito, MD     Time Spent with Patient: I have spent a total of 35 minutes with patient reviewing hospital notes, telemetry, EKGs, labs and examining the patient as well as establishing an assessment and plan that was discussed with the patient.  > 50% of time was spent in direct patient care.  Signed, Addison Naegeli. Audie Box, [place]  [address] [place], [place] [address] [phone]  11/01/2020 9:24 AM

## 2020-11-01 ENCOUNTER — Other Ambulatory Visit: Payer: Self-pay

## 2020-11-01 ENCOUNTER — Ambulatory Visit (INDEPENDENT_AMBULATORY_CARE_PROVIDER_SITE_OTHER): Payer: Medicare PPO | Admitting: Cardiovascular Disease

## 2020-11-01 ENCOUNTER — Encounter: Payer: Self-pay | Admitting: Cardiovascular Disease

## 2020-11-01 VITALS — BP 152/82 | HR 70 | Ht 62.0 in | Wt 137.0 lb

## 2020-11-01 DIAGNOSIS — R9431 Abnormal electrocardiogram [ECG] [EKG]: Secondary | ICD-10-CM

## 2020-11-01 DIAGNOSIS — I10 Essential (primary) hypertension: Secondary | ICD-10-CM | POA: Diagnosis not present

## 2020-11-01 DIAGNOSIS — R0789 Other chest pain: Secondary | ICD-10-CM | POA: Diagnosis not present

## 2020-11-01 MED ORDER — AMLODIPINE BESYLATE 5 MG PO TABS: 5.0000 mg | ORAL_TABLET | Freq: Every day | ORAL | 1 refills | Status: DC

## 2020-11-01 NOTE — Patient Instructions (Signed)
Medication Instructions:  Stop Aspirin Increase Amlodipine 5 mg daily   *If you need a refill on your cardiac medications before your next appointment, please call your pharmacy*  Testing/Procedures:  Your physician has requested that you have a lexiscan myoview. A cardiac stress test is a cardiological test that measures the heart's ability to respond to external stress in a controlled clinical environment. The stress response is induced by intravenous pharmacological stimulation.   CALCIUM SCORE   Follow-Up: At [facility], you and your health needs are our priority.  As part of our continuing mission to provide you with exceptional heart care, we have created designated Provider Care Teams.  These Care Teams include your primary Cardiologist (physician) and Advanced Practice Providers (APPs -  Physician Assistants and Nurse Practitioners) who all work together to provide you with the care you need, when you need it.  We recommend signing up for the patient portal called "MyChart".  Sign up information is provided on this After Visit Summary.  MyChart is used to connect with patients for Virtual Visits (Telemedicine).  Patients are able to view lab/test results, encounter notes, upcoming appointments, etc.  Non-urgent messages can be sent to your provider as well.   To learn more about what you can do with MyChart, go to [email or web address].    Your next appointment:   As needed  The format for your next appointment:   In Person  Provider:   Eleonore Chiquito, MD

## 2020-11-12 ENCOUNTER — Other Ambulatory Visit: Payer: Self-pay | Admitting: Family Medicine

## 2020-11-12 DIAGNOSIS — R109 Unspecified abdominal pain: Secondary | ICD-10-CM

## 2020-11-18 ENCOUNTER — Telehealth (HOSPITAL_COMMUNITY): Payer: Self-pay | Admitting: Cardiovascular Disease

## 2020-11-18 NOTE — Telephone Encounter (Signed)
FYI. Thanks.

## 2020-11-18 NOTE — Telephone Encounter (Signed)
Patient cancelled Myoview for reason below:  11/18/2020 8:26 AM   By:  Darrin Nipper L   Cancel Rsn: Patient (not feeling well. Will call back to r/s)    Order will be removed from the [facility] and when pt calls back we can reinstate the order.

## 2020-11-19 ENCOUNTER — Inpatient Hospital Stay: Admission: RE | Admit: 2020-11-19 | Payer: Medicare PPO | Source: Ambulatory Visit

## 2020-11-21 ENCOUNTER — Inpatient Hospital Stay (HOSPITAL_COMMUNITY): Admission: RE | Admit: 2020-11-21 | Payer: Medicare PPO | Source: Ambulatory Visit

## 2020-11-21 ENCOUNTER — Other Ambulatory Visit: Payer: Self-pay

## 2020-11-21 MED ORDER — AMLODIPINE BESYLATE 5 MG PO TABS: 5.0000 mg | ORAL_TABLET | Freq: Every day | ORAL | 3 refills | Status: DC

## 2020-11-27 ENCOUNTER — Inpatient Hospital Stay: Admission: RE | Admit: 2020-11-27 | Payer: Medicare PPO | Source: Ambulatory Visit

## 2020-12-11 ENCOUNTER — Other Ambulatory Visit: Payer: Self-pay

## 2020-12-11 ENCOUNTER — Ambulatory Visit (INDEPENDENT_AMBULATORY_CARE_PROVIDER_SITE_OTHER)
Admission: RE | Admit: 2020-12-11 | Discharge: 2020-12-11 | Disposition: A | Discharge status: Home / Self Care | Payer: Self-pay | Source: Ambulatory Visit | Attending: Cardiovascular Disease | Admitting: Cardiovascular Disease

## 2020-12-11 DIAGNOSIS — R0789 Other chest pain: Secondary | ICD-10-CM

## 2020-12-31 ENCOUNTER — Other Ambulatory Visit: Payer: Self-pay

## 2020-12-31 ENCOUNTER — Ambulatory Visit
Admission: RE | Admit: 2020-12-31 | Discharge: 2020-12-31 | Disposition: A | Discharge status: Home / Self Care | Payer: Medicare PPO | Source: Ambulatory Visit | Attending: Family Medicine | Admitting: Family Medicine

## 2020-12-31 DIAGNOSIS — R109 Unspecified abdominal pain: Secondary | ICD-10-CM

## 2020-12-31 MED ORDER — IOPAMIDOL (ISOVUE-300) INJECTION 61%
100.0000 mL | Freq: Once | INTRAVENOUS | Status: AC | PRN
  Administered 2020-12-31: 100 mL via INTRAVENOUS

## 2021-02-12 DIAGNOSIS — H2512 Age-related nuclear cataract, left eye: Secondary | ICD-10-CM | POA: Diagnosis not present

## 2021-02-14 DIAGNOSIS — H25812 Combined forms of age-related cataract, left eye: Secondary | ICD-10-CM | POA: Diagnosis not present

## 2021-02-19 DIAGNOSIS — Z0000 Encounter for general adult medical examination without abnormal findings: Secondary | ICD-10-CM | POA: Diagnosis not present

## 2021-02-19 DIAGNOSIS — Z1389 Encounter for screening for other disorder: Secondary | ICD-10-CM | POA: Diagnosis not present

## 2021-03-05 DIAGNOSIS — K219 Gastro-esophageal reflux disease without esophagitis: Secondary | ICD-10-CM | POA: Diagnosis not present

## 2021-03-05 DIAGNOSIS — F419 Anxiety disorder, unspecified: Secondary | ICD-10-CM | POA: Diagnosis not present

## 2021-03-05 DIAGNOSIS — I10 Essential (primary) hypertension: Secondary | ICD-10-CM | POA: Diagnosis not present

## 2021-03-05 DIAGNOSIS — N1831 Chronic kidney disease, stage 3a: Secondary | ICD-10-CM | POA: Diagnosis not present

## 2021-03-05 DIAGNOSIS — E039 Hypothyroidism, unspecified: Secondary | ICD-10-CM | POA: Diagnosis not present

## 2021-03-05 DIAGNOSIS — G8929 Other chronic pain: Secondary | ICD-10-CM | POA: Diagnosis not present

## 2021-03-05 DIAGNOSIS — Z136 Encounter for screening for cardiovascular disorders: Secondary | ICD-10-CM | POA: Diagnosis not present

## 2021-03-05 DIAGNOSIS — Z13220 Encounter for screening for lipoid disorders: Secondary | ICD-10-CM | POA: Diagnosis not present

## 2021-03-05 DIAGNOSIS — M79672 Pain in left foot: Secondary | ICD-10-CM | POA: Diagnosis not present

## 2021-03-11 DIAGNOSIS — L720 Epidermal cyst: Secondary | ICD-10-CM | POA: Diagnosis not present

## 2021-03-11 DIAGNOSIS — H61001 Unspecified perichondritis of right external ear: Secondary | ICD-10-CM | POA: Diagnosis not present

## 2021-03-14 DIAGNOSIS — N8110 Cystocele, unspecified: Secondary | ICD-10-CM | POA: Diagnosis not present

## 2021-03-14 DIAGNOSIS — Z4689 Encounter for fitting and adjustment of other specified devices: Secondary | ICD-10-CM | POA: Diagnosis not present

## 2021-04-23 DIAGNOSIS — M170 Bilateral primary osteoarthritis of knee: Secondary | ICD-10-CM | POA: Diagnosis not present

## 2021-05-23 DIAGNOSIS — Z4689 Encounter for fitting and adjustment of other specified devices: Secondary | ICD-10-CM | POA: Diagnosis not present

## 2021-05-23 DIAGNOSIS — N8110 Cystocele, unspecified: Secondary | ICD-10-CM | POA: Diagnosis not present

## 2021-05-23 DIAGNOSIS — R1031 Right lower quadrant pain: Secondary | ICD-10-CM | POA: Diagnosis not present

## 2021-05-23 DIAGNOSIS — R399 Unspecified symptoms and signs involving the genitourinary system: Secondary | ICD-10-CM | POA: Diagnosis not present

## 2021-05-23 DIAGNOSIS — R3989 Other symptoms and signs involving the genitourinary system: Secondary | ICD-10-CM | POA: Diagnosis not present

## 2021-06-02 DIAGNOSIS — R1031 Right lower quadrant pain: Secondary | ICD-10-CM | POA: Diagnosis not present

## 2021-06-24 DIAGNOSIS — M7552 Bursitis of left shoulder: Secondary | ICD-10-CM | POA: Diagnosis not present

## 2021-06-24 DIAGNOSIS — R079 Chest pain, unspecified: Secondary | ICD-10-CM | POA: Diagnosis not present

## 2021-06-27 DIAGNOSIS — M7552 Bursitis of left shoulder: Secondary | ICD-10-CM | POA: Diagnosis not present

## 2021-07-01 DIAGNOSIS — M7552 Bursitis of left shoulder: Secondary | ICD-10-CM | POA: Diagnosis not present

## 2021-07-07 DIAGNOSIS — M7552 Bursitis of left shoulder: Secondary | ICD-10-CM | POA: Diagnosis not present

## 2021-07-10 DIAGNOSIS — M7552 Bursitis of left shoulder: Secondary | ICD-10-CM | POA: Diagnosis not present

## 2021-07-14 DIAGNOSIS — M7552 Bursitis of left shoulder: Secondary | ICD-10-CM | POA: Diagnosis not present

## 2021-07-21 DIAGNOSIS — M7552 Bursitis of left shoulder: Secondary | ICD-10-CM | POA: Diagnosis not present

## 2021-07-24 DIAGNOSIS — M7552 Bursitis of left shoulder: Secondary | ICD-10-CM | POA: Diagnosis not present

## 2021-07-30 DIAGNOSIS — M7552 Bursitis of left shoulder: Secondary | ICD-10-CM | POA: Diagnosis not present

## 2021-08-14 DIAGNOSIS — N8110 Cystocele, unspecified: Secondary | ICD-10-CM | POA: Diagnosis not present

## 2021-08-14 DIAGNOSIS — N952 Postmenopausal atrophic vaginitis: Secondary | ICD-10-CM | POA: Diagnosis not present

## 2021-08-14 DIAGNOSIS — Z4689 Encounter for fitting and adjustment of other specified devices: Secondary | ICD-10-CM | POA: Diagnosis not present

## 2021-09-04 DIAGNOSIS — G8929 Other chronic pain: Secondary | ICD-10-CM | POA: Diagnosis not present

## 2021-09-04 DIAGNOSIS — F419 Anxiety disorder, unspecified: Secondary | ICD-10-CM | POA: Diagnosis not present

## 2021-09-04 DIAGNOSIS — I10 Essential (primary) hypertension: Secondary | ICD-10-CM | POA: Diagnosis not present

## 2021-09-04 DIAGNOSIS — E039 Hypothyroidism, unspecified: Secondary | ICD-10-CM | POA: Diagnosis not present

## 2021-09-04 DIAGNOSIS — N1831 Chronic kidney disease, stage 3a: Secondary | ICD-10-CM | POA: Diagnosis not present

## 2021-09-04 DIAGNOSIS — I700 Atherosclerosis of aorta: Secondary | ICD-10-CM | POA: Diagnosis not present

## 2021-09-17 DIAGNOSIS — H04123 Dry eye syndrome of bilateral lacrimal glands: Secondary | ICD-10-CM | POA: Diagnosis not present

## 2021-09-17 DIAGNOSIS — Z961 Presence of intraocular lens: Secondary | ICD-10-CM | POA: Diagnosis not present

## 2021-10-24 DIAGNOSIS — Z4689 Encounter for fitting and adjustment of other specified devices: Secondary | ICD-10-CM | POA: Diagnosis not present

## 2021-10-24 DIAGNOSIS — Z1231 Encounter for screening mammogram for malignant neoplasm of breast: Secondary | ICD-10-CM | POA: Diagnosis not present

## 2021-10-24 DIAGNOSIS — N8110 Cystocele, unspecified: Secondary | ICD-10-CM | POA: Diagnosis not present

## 2021-10-30 DIAGNOSIS — M5432 Sciatica, left side: Secondary | ICD-10-CM | POA: Diagnosis not present

## 2021-10-30 DIAGNOSIS — H60391 Other infective otitis externa, right ear: Secondary | ICD-10-CM | POA: Diagnosis not present

## 2021-11-06 DIAGNOSIS — M5442 Lumbago with sciatica, left side: Secondary | ICD-10-CM | POA: Diagnosis not present

## 2021-11-09 ENCOUNTER — Other Ambulatory Visit: Payer: Self-pay | Admitting: Cardiovascular Disease

## 2021-11-26 DIAGNOSIS — M79605 Pain in left leg: Secondary | ICD-10-CM | POA: Diagnosis not present

## 2021-12-12 DIAGNOSIS — R0981 Nasal congestion: Secondary | ICD-10-CM | POA: Diagnosis not present

## 2021-12-12 DIAGNOSIS — J329 Chronic sinusitis, unspecified: Secondary | ICD-10-CM | POA: Diagnosis not present

## 2022-01-19 DIAGNOSIS — G8929 Other chronic pain: Secondary | ICD-10-CM | POA: Diagnosis not present

## 2022-01-19 DIAGNOSIS — Z4689 Encounter for fitting and adjustment of other specified devices: Secondary | ICD-10-CM | POA: Diagnosis not present

## 2022-01-19 DIAGNOSIS — N8110 Cystocele, unspecified: Secondary | ICD-10-CM | POA: Diagnosis not present

## 2022-01-19 DIAGNOSIS — R32 Unspecified urinary incontinence: Secondary | ICD-10-CM | POA: Diagnosis not present

## 2022-01-22 ENCOUNTER — Ambulatory Visit
Admission: RE | Admit: 2022-01-22 | Discharge: 2022-01-22 | Disposition: A | Discharge status: Home / Self Care | Payer: Medicare PPO | Source: Ambulatory Visit | Attending: Family Medicine | Admitting: Family Medicine

## 2022-01-22 ENCOUNTER — Other Ambulatory Visit: Payer: Self-pay | Admitting: Family Medicine

## 2022-01-22 DIAGNOSIS — Z20822 Contact with and (suspected) exposure to covid-19: Secondary | ICD-10-CM | POA: Diagnosis not present

## 2022-01-22 DIAGNOSIS — M545 Low back pain, unspecified: Secondary | ICD-10-CM | POA: Diagnosis not present

## 2022-01-22 DIAGNOSIS — R200 Anesthesia of skin: Secondary | ICD-10-CM | POA: Diagnosis not present

## 2022-01-22 DIAGNOSIS — I10 Essential (primary) hypertension: Secondary | ICD-10-CM | POA: Diagnosis not present

## 2022-01-22 DIAGNOSIS — M48061 Spinal stenosis, lumbar region without neurogenic claudication: Secondary | ICD-10-CM | POA: Diagnosis not present

## 2022-01-22 DIAGNOSIS — M4800 Spinal stenosis, site unspecified: Secondary | ICD-10-CM

## 2022-01-22 DIAGNOSIS — J329 Chronic sinusitis, unspecified: Secondary | ICD-10-CM | POA: Diagnosis not present

## 2022-01-22 DIAGNOSIS — J310 Chronic rhinitis: Secondary | ICD-10-CM | POA: Diagnosis not present

## 2022-02-02 DIAGNOSIS — M48062 Spinal stenosis, lumbar region with neurogenic claudication: Secondary | ICD-10-CM | POA: Diagnosis not present

## 2022-02-02 DIAGNOSIS — M4727 Other spondylosis with radiculopathy, lumbosacral region: Secondary | ICD-10-CM | POA: Diagnosis not present

## 2022-02-02 DIAGNOSIS — M4317 Spondylolisthesis, lumbosacral region: Secondary | ICD-10-CM | POA: Diagnosis not present

## 2022-02-02 DIAGNOSIS — G894 Chronic pain syndrome: Secondary | ICD-10-CM | POA: Diagnosis not present

## 2022-02-03 ENCOUNTER — Other Ambulatory Visit: Payer: Self-pay | Admitting: Anesthesiology

## 2022-02-03 DIAGNOSIS — M545 Low back pain, unspecified: Secondary | ICD-10-CM

## 2022-02-06 DIAGNOSIS — J329 Chronic sinusitis, unspecified: Secondary | ICD-10-CM | POA: Diagnosis not present

## 2022-02-06 DIAGNOSIS — I10 Essential (primary) hypertension: Secondary | ICD-10-CM | POA: Diagnosis not present

## 2022-02-06 DIAGNOSIS — J310 Chronic rhinitis: Secondary | ICD-10-CM | POA: Diagnosis not present

## 2022-02-06 DIAGNOSIS — R151 Fecal smearing: Secondary | ICD-10-CM | POA: Diagnosis not present

## 2022-02-06 DIAGNOSIS — E039 Hypothyroidism, unspecified: Secondary | ICD-10-CM | POA: Diagnosis not present

## 2022-02-06 DIAGNOSIS — D500 Iron deficiency anemia secondary to blood loss (chronic): Secondary | ICD-10-CM | POA: Diagnosis not present

## 2022-02-06 DIAGNOSIS — M48061 Spinal stenosis, lumbar region without neurogenic claudication: Secondary | ICD-10-CM | POA: Diagnosis not present

## 2022-02-06 DIAGNOSIS — R5383 Other fatigue: Secondary | ICD-10-CM | POA: Diagnosis not present

## 2022-02-12 DIAGNOSIS — E875 Hyperkalemia: Secondary | ICD-10-CM | POA: Diagnosis not present

## 2022-02-14 ENCOUNTER — Ambulatory Visit
Admission: RE | Admit: 2022-02-14 | Discharge: 2022-02-14 | Disposition: A | Discharge status: Home / Self Care | Payer: Medicare PPO | Source: Ambulatory Visit | Attending: Anesthesiology | Admitting: Anesthesiology

## 2022-02-14 DIAGNOSIS — M545 Low back pain, unspecified: Secondary | ICD-10-CM

## 2022-02-14 DIAGNOSIS — M4807 Spinal stenosis, lumbosacral region: Secondary | ICD-10-CM | POA: Diagnosis not present

## 2022-02-14 DIAGNOSIS — M48061 Spinal stenosis, lumbar region without neurogenic claudication: Secondary | ICD-10-CM | POA: Diagnosis not present

## 2022-02-20 DIAGNOSIS — M48062 Spinal stenosis, lumbar region with neurogenic claudication: Secondary | ICD-10-CM | POA: Diagnosis not present

## 2022-02-20 DIAGNOSIS — M4727 Other spondylosis with radiculopathy, lumbosacral region: Secondary | ICD-10-CM | POA: Diagnosis not present

## 2022-02-20 DIAGNOSIS — M4317 Spondylolisthesis, lumbosacral region: Secondary | ICD-10-CM | POA: Diagnosis not present

## 2022-02-20 DIAGNOSIS — G894 Chronic pain syndrome: Secondary | ICD-10-CM | POA: Diagnosis not present

## 2022-02-24 DIAGNOSIS — Z0000 Encounter for general adult medical examination without abnormal findings: Secondary | ICD-10-CM | POA: Diagnosis not present

## 2022-02-24 DIAGNOSIS — Z23 Encounter for immunization: Secondary | ICD-10-CM | POA: Diagnosis not present

## 2022-02-24 DIAGNOSIS — Z1389 Encounter for screening for other disorder: Secondary | ICD-10-CM | POA: Diagnosis not present

## 2022-03-04 DIAGNOSIS — J324 Chronic pansinusitis: Secondary | ICD-10-CM | POA: Diagnosis not present

## 2022-03-04 DIAGNOSIS — J322 Chronic ethmoidal sinusitis: Secondary | ICD-10-CM | POA: Diagnosis not present

## 2022-03-04 DIAGNOSIS — Z9889 Other specified postprocedural states: Secondary | ICD-10-CM | POA: Diagnosis not present

## 2022-03-04 DIAGNOSIS — J329 Chronic sinusitis, unspecified: Secondary | ICD-10-CM | POA: Diagnosis not present

## 2022-03-05 DIAGNOSIS — I700 Atherosclerosis of aorta: Secondary | ICD-10-CM | POA: Diagnosis not present

## 2022-03-05 DIAGNOSIS — N1831 Chronic kidney disease, stage 3a: Secondary | ICD-10-CM | POA: Diagnosis not present

## 2022-03-05 DIAGNOSIS — E875 Hyperkalemia: Secondary | ICD-10-CM | POA: Diagnosis not present

## 2022-03-05 DIAGNOSIS — I10 Essential (primary) hypertension: Secondary | ICD-10-CM | POA: Diagnosis not present

## 2022-03-05 DIAGNOSIS — Z0000 Encounter for general adult medical examination without abnormal findings: Secondary | ICD-10-CM | POA: Diagnosis not present

## 2022-03-05 DIAGNOSIS — D649 Anemia, unspecified: Secondary | ICD-10-CM | POA: Diagnosis not present

## 2022-03-05 DIAGNOSIS — F419 Anxiety disorder, unspecified: Secondary | ICD-10-CM | POA: Diagnosis not present

## 2022-03-06 DIAGNOSIS — M4317 Spondylolisthesis, lumbosacral region: Secondary | ICD-10-CM | POA: Diagnosis not present

## 2022-03-06 DIAGNOSIS — M48062 Spinal stenosis, lumbar region with neurogenic claudication: Secondary | ICD-10-CM | POA: Diagnosis not present

## 2022-03-09 DIAGNOSIS — R194 Change in bowel habit: Secondary | ICD-10-CM | POA: Diagnosis not present

## 2022-03-09 DIAGNOSIS — Z86010 Personal history of colonic polyps: Secondary | ICD-10-CM | POA: Diagnosis not present

## 2022-03-09 DIAGNOSIS — D509 Iron deficiency anemia, unspecified: Secondary | ICD-10-CM | POA: Diagnosis not present

## 2022-03-19 DIAGNOSIS — D649 Anemia, unspecified: Secondary | ICD-10-CM | POA: Diagnosis not present

## 2022-03-19 DIAGNOSIS — E875 Hyperkalemia: Secondary | ICD-10-CM | POA: Diagnosis not present

## 2022-04-02 DIAGNOSIS — J324 Chronic pansinusitis: Secondary | ICD-10-CM | POA: Diagnosis not present

## 2022-04-09 ENCOUNTER — Other Ambulatory Visit: Payer: Self-pay | Admitting: Neurosurgery

## 2022-04-13 DIAGNOSIS — M5430 Sciatica, unspecified side: Secondary | ICD-10-CM | POA: Diagnosis not present

## 2022-04-13 DIAGNOSIS — N951 Menopausal and female climacteric states: Secondary | ICD-10-CM | POA: Diagnosis not present

## 2022-04-13 DIAGNOSIS — E039 Hypothyroidism, unspecified: Secondary | ICD-10-CM | POA: Diagnosis not present

## 2022-04-13 DIAGNOSIS — I10 Essential (primary) hypertension: Secondary | ICD-10-CM | POA: Diagnosis not present

## 2022-04-13 DIAGNOSIS — G629 Polyneuropathy, unspecified: Secondary | ICD-10-CM | POA: Diagnosis not present

## 2022-04-13 DIAGNOSIS — M4800 Spinal stenosis, site unspecified: Secondary | ICD-10-CM | POA: Diagnosis not present

## 2022-04-13 DIAGNOSIS — M1990 Unspecified osteoarthritis, unspecified site: Secondary | ICD-10-CM | POA: Diagnosis not present

## 2022-04-13 DIAGNOSIS — F419 Anxiety disorder, unspecified: Secondary | ICD-10-CM | POA: Diagnosis not present

## 2022-04-13 DIAGNOSIS — D649 Anemia, unspecified: Secondary | ICD-10-CM | POA: Diagnosis not present

## 2022-04-16 ENCOUNTER — Other Ambulatory Visit: Payer: Self-pay | Admitting: Neurosurgery

## 2022-04-20 ENCOUNTER — Other Ambulatory Visit: Payer: Self-pay | Admitting: Obstetrics

## 2022-04-20 DIAGNOSIS — Z13820 Encounter for screening for osteoporosis: Secondary | ICD-10-CM | POA: Diagnosis not present

## 2022-04-20 DIAGNOSIS — Z01411 Encounter for gynecological examination (general) (routine) with abnormal findings: Secondary | ICD-10-CM | POA: Diagnosis not present

## 2022-04-20 DIAGNOSIS — Z0142 Encounter for cervical smear to confirm findings of recent normal smear following initial abnormal smear: Secondary | ICD-10-CM | POA: Diagnosis not present

## 2022-04-20 DIAGNOSIS — Z01419 Encounter for gynecological examination (general) (routine) without abnormal findings: Secondary | ICD-10-CM | POA: Diagnosis not present

## 2022-04-20 DIAGNOSIS — Z803 Family history of malignant neoplasm of breast: Secondary | ICD-10-CM | POA: Diagnosis not present

## 2022-04-20 DIAGNOSIS — M810 Age-related osteoporosis without current pathological fracture: Secondary | ICD-10-CM

## 2022-04-20 DIAGNOSIS — Z124 Encounter for screening for malignant neoplasm of cervix: Secondary | ICD-10-CM | POA: Diagnosis not present

## 2022-04-20 DIAGNOSIS — R32 Unspecified urinary incontinence: Secondary | ICD-10-CM | POA: Diagnosis not present

## 2022-04-20 DIAGNOSIS — N8110 Cystocele, unspecified: Secondary | ICD-10-CM | POA: Diagnosis not present

## 2022-04-20 DIAGNOSIS — Z4689 Encounter for fitting and adjustment of other specified devices: Secondary | ICD-10-CM | POA: Diagnosis not present

## 2022-04-23 DIAGNOSIS — J3489 Other specified disorders of nose and nasal sinuses: Secondary | ICD-10-CM | POA: Diagnosis not present

## 2022-04-23 DIAGNOSIS — R519 Headache, unspecified: Secondary | ICD-10-CM | POA: Diagnosis not present

## 2022-05-04 DIAGNOSIS — N390 Urinary tract infection, site not specified: Secondary | ICD-10-CM | POA: Diagnosis not present

## 2022-05-04 DIAGNOSIS — R300 Dysuria: Secondary | ICD-10-CM | POA: Diagnosis not present

## 2022-05-12 NOTE — Progress Notes (Signed)
Surgical Instructions    Your procedure is scheduled on Wednesday, August 16th, 2023.   Report to [facility] Main Entrance "A" at 06:30 A.M., then check in with the Admitting office.  Call this number if you have problems the morning of surgery:  [phone]   If you have any questions prior to your surgery date call [phone]: Open Monday-Friday 8am-4pm    Remember:  Do not eat after midnight the night before your surgery  You may drink clear liquids until 05:30 the morning of your surgery.   Clear liquids allowed are: Water, Non-Citrus Juices (without pulp), Carbonated Beverages, Clear Tea, Black Coffee ONLY (NO MILK, CREAM OR POWDERED CREAMER of any kind), and Gatorade    Take these medicines the morning of surgery with A SIP OF WATER:   amLODipine (NORVASC) levothyroxine (SYNTHROID)  metoprolol tartrate (LOPRESSOR)  nitrofurantoin, macrocrystal-monohydrate, (MACROBID)  ALPRAZolam (XANAX) - as needed  As of today, STOP taking any Aspirin (unless otherwise instructed by your surgeon) Aleve, Naproxen, Ibuprofen, Motrin, Advil, Goody's, BC's, all herbal medications, fish oil, and all vitamins.   The day of surgery:          Do not wear jewelry or makeup. Do not wear lotions, powders, perfumes or deodorant. Do not shave 48 hours prior to surgery.   Do not bring valuables to the hospital. Do not wear nail polish, gel polish, artificial nails, or any other type of covering on natural nails (fingers and toes) If you have artificial nails or gel coating that need to be removed by a nail salon, please have this removed prior to surgery. Artificial nails or gel coating may interfere with anesthesia's ability to adequately monitor your vital signs.   [facility] is not responsible for any belongings or valuables.    Do NOT Smoke (Tobacco/Vaping)  24 hours prior to your procedure  If you use a CPAP at night, you may bring your mask for your overnight stay.   Contacts, glasses,  hearing aids, dentures or partials may not be worn into surgery, please bring cases for these belongings   For patients admitted to the hospital, discharge time will be determined by your treatment team.   Patients discharged the day of surgery will not be allowed to drive home, and someone needs to stay with them for 24 hours.   SURGICAL WAITING ROOM VISITATION Patients having surgery or a procedure may have no more than 2 support people in the waiting area - these visitors may rotate.   Children under the age of 71 must have an adult with them who is not the patient. If the patient needs to stay at the hospital during part of their recovery, the visitor guidelines for inpatient rooms apply. Pre-op nurse will coordinate an appropriate time for 1 support person to accompany patient in pre-op.  This support person may not rotate.   Please refer to the [facility] website for the visitor guidelines for Inpatients (after your surgery is over and you are in a regular room).    Special instructions:    Oral Hygiene is also important to reduce your risk of infection.  Remember - BRUSH YOUR TEETH THE MORNING OF SURGERY WITH YOUR REGULAR TOOTHPASTE   [place]- Preparing For Surgery  Before surgery, you can play an important role. Because skin is not sterile, your skin needs to be as free of germs as possible. You can reduce the number of germs on your skin by washing with CHG (chlorahexidine gluconate) Soap before  surgery.  CHG is an antiseptic cleaner which kills germs and bonds with the skin to continue killing germs even after washing.     Please do not use if you have an allergy to CHG or antibacterial soaps. If your skin becomes reddened/irritated stop using the CHG.  Do not shave (including legs and underarms) for at least 48 hours prior to first CHG shower. It is OK to shave your face.  Please follow these instructions carefully.     Shower the NIGHT BEFORE SURGERY and the MORNING OF  SURGERY with CHG Soap.   If you chose to wash your hair, wash your hair first as usual with your normal shampoo. After you shampoo, rinse your hair and body thoroughly to remove the shampoo.  Then [employer] and genitals (private parts) with your normal soap and rinse thoroughly to remove soap.  After that Use CHG Soap as you would any other liquid soap. You can apply CHG directly to the skin and wash gently with a scrungie or a clean washcloth.   Apply the CHG Soap to your body ONLY FROM THE NECK DOWN.  Do not use on open wounds or open sores. Avoid contact with your eyes, ears, mouth and genitals (private parts). Wash Face and genitals (private parts)  with your normal soap.   Wash thoroughly, paying special attention to the area where your surgery will be performed.  Thoroughly rinse your body with warm water from the neck down.  DO NOT shower/wash with your normal soap after using and rinsing off the CHG Soap.  Pat yourself dry with a CLEAN TOWEL.  Wear CLEAN PAJAMAS to bed the night before surgery  Place CLEAN SHEETS on your bed the night before your surgery  DO NOT SLEEP WITH PETS.   Day of Surgery:  Take a shower with CHG soap. Wear Clean/Comfortable clothing the morning of surgery Do not apply any deodorants/lotions.   Remember to brush your teeth WITH YOUR REGULAR TOOTHPASTE.    If you received a COVID test during your pre-op visit, it is requested that you wear a mask when out in public, stay away from anyone that may not be feeling well, and notify your surgeon if you develop symptoms. If you have been in contact with anyone that has tested positive in the last 10 days, please notify your surgeon.    Please read over the following fact sheets that you were given.

## 2022-05-13 ENCOUNTER — Other Ambulatory Visit: Payer: Self-pay

## 2022-05-13 ENCOUNTER — Encounter (HOSPITAL_COMMUNITY): Payer: Self-pay

## 2022-05-13 ENCOUNTER — Encounter (HOSPITAL_COMMUNITY)
Admission: RE | Admit: 2022-05-13 | Discharge: 2022-05-13 | Disposition: A | Discharge status: Home / Self Care | Payer: Medicare PPO | Source: Ambulatory Visit | Attending: Neurosurgery | Admitting: Neurosurgery

## 2022-05-13 VITALS — BP 164/73 | HR 68 | Temp 97.7°F | Resp 17 | Ht 63.0 in | Wt 136.7 lb

## 2022-05-13 DIAGNOSIS — Z01818 Encounter for other preprocedural examination: Secondary | ICD-10-CM | POA: Insufficient documentation

## 2022-05-13 DIAGNOSIS — I10 Essential (primary) hypertension: Secondary | ICD-10-CM | POA: Insufficient documentation

## 2022-05-13 DIAGNOSIS — D509 Iron deficiency anemia, unspecified: Secondary | ICD-10-CM | POA: Insufficient documentation

## 2022-05-13 HISTORY — DX: Anxiety disorder, unspecified: F41.9

## 2022-05-13 HISTORY — DX: Headache, unspecified: R51.9

## 2022-05-13 HISTORY — DX: Hypothyroidism, unspecified: E03.9

## 2022-05-13 HISTORY — DX: Chronic kidney disease, unspecified: N18.9

## 2022-05-13 HISTORY — DX: Unspecified osteoarthritis, unspecified site: M19.90

## 2022-05-13 LAB — CBC
HCT: 41.5 % (ref 36.0–46.0)
Hemoglobin: 13.3 g/dL (ref 12.0–15.0)
MCH: 30.1 pg (ref 26.0–34.0)
MCHC: 32 g/dL (ref 30.0–36.0)
MCV: 93.9 fL (ref 80.0–100.0)
Platelets: 215 10*3/uL (ref 150–400)
RBC: 4.42 MIL/uL (ref 3.87–5.11)
RDW: 14.6 % (ref 11.5–15.5)
WBC: 8.5 10*3/uL (ref 4.0–10.5)
nRBC: 0 % (ref 0.0–0.2)

## 2022-05-13 LAB — SURGICAL PCR SCREEN
MRSA, PCR: NEGATIVE
Staphylococcus aureus: NEGATIVE

## 2022-05-13 LAB — TYPE AND SCREEN
ABO/RH(D): O POS
Antibody Screen: NEGATIVE

## 2022-05-13 LAB — BASIC METABOLIC PANEL
Anion gap: 8 (ref 5–15)
BUN: 12 mg/dL (ref 8–23)
CO2: 25 mmol/L (ref 22–32)
Calcium: 10.2 mg/dL (ref 8.9–10.3)
Chloride: 103 mmol/L (ref 98–111)
Creatinine, Ser: 0.77 mg/dL (ref 0.44–1.00)
GFR, Estimated: >60 mL/min (ref >60)
Glucose, Bld: 100 mg/dL — ABNORMAL HIGH (ref 70–99)
Potassium: 4.6 mmol/L (ref 3.5–5.1)
Sodium: 136 mmol/L (ref 135–145)

## 2022-05-13 NOTE — Progress Notes (Signed)
PCP - Dr. Hulan Fess Cardiologist - Pt saw Dr. Audie Box 11/01/20. Stress test was recommended per his note. Pt cancelled and there was no rescheduled appt or f/u. Pt does not have much recollection of stress test being cancelled, but she does remember seeing Dr. Audie Box.  PPM/ICD - denies   Chest x-ray - 01/27/12 EKG - 05/03/20 Stress Test - around 15 years ago per pt, no abnormalities per pt ECHO - 05/24/20 Cardiac Cath - denies  Sleep Study - denies   DM- denies  ASA/Blood Thinner Instructions: n/a   ERAS Protcol - yes, no drink   COVID TEST- n/a   Anesthesia review: yes, pt may need stress test according to Dr. Kathalene Frames note on 11/01/20. Pt made aware of this possibility.  Patient denies shortness of breath, fever, cough and chest pain at PAT appointment   All instructions explained to the patient, with a verbal understanding of the material. Patient agrees to go over the instructions while at home for a better understanding. The opportunity to ask questions was provided.

## 2022-05-14 ENCOUNTER — Encounter (HOSPITAL_COMMUNITY): Payer: Self-pay | Admitting: Physician Assistant

## 2022-05-19 ENCOUNTER — Telehealth: Payer: Self-pay | Admitting: Cardiovascular Disease

## 2022-05-19 DIAGNOSIS — B3731 Acute candidiasis of vulva and vagina: Secondary | ICD-10-CM | POA: Diagnosis not present

## 2022-05-19 DIAGNOSIS — R350 Frequency of micturition: Secondary | ICD-10-CM | POA: Diagnosis not present

## 2022-05-19 NOTE — Telephone Encounter (Signed)
I called the surgeon office to advise that the pt is going to need an in office appt. Was stated to me that the they have been faxing over a clearance request x 3 since 05/13/22. I stated I cannot account for the clearance request when they are sent to the office location where the pt is seen, in case most likely went to [facility] office. I advised to send any further clearance request to fax # [phone], this was noted. I did advise not only needs in office appt, the pt last seen 10/2020 and cancelled the stress test that Dr. Audie Box ordered last 11/2020.   I assured that I will call the pt and set up appt. I did tell the requesting office not sure if MD will re-order the stress test as well.    [name] and she has been scheduled for pre op appt with Denise Memos, FNP 05/21/22 @ 8:50 at the [facility] location. I assured the pt that I will update the surgeon office as well.

## 2022-05-19 NOTE — Telephone Encounter (Signed)
   Name: Denise Hoffman  DOB: 09-Dec-1935  MRN: [id number]  Primary Cardiologist: Evalina Field, MD  Chart reviewed as part of pre-operative protocol coverage. Because of Denise Hoffman's past medical history and time since last visit, she will require a follow-up in-office visit in order to better assess preoperative cardiovascular risk.  Pre-op covering staff: - Please schedule appointment and call patient to inform them. If patient already had an upcoming appointment within acceptable timeframe, please add "pre-op clearance" to the appointment notes so provider is aware. - Please contact requesting surgeon's office via preferred method (i.e, phone, fax) to inform them of need for appointment prior to surgery.  Lenna Sciara, NP  05/19/2022, 11:27 AM

## 2022-05-19 NOTE — Telephone Encounter (Signed)
   Pre-operative Risk Assessment    Patient Name: Denise Hoffman  DOB: [birth year] MRN: [id number]      Request for Surgical Clearance    Procedure:   Lumbar fusion   Date of Surgery:  Clearance 05/20/22                                 Surgeon:  Dr. Newman Pies  Surgeon's Group or Practice Name:  [facility]  Phone number:  [phone] Fax number:  [phone]   Type of Clearance Requested:   - Medical    Type of Anesthesia:  General    Additional requests/questions:   n/a  Dorthey Sawyer   05/19/2022, 10:46 AM

## 2022-05-20 ENCOUNTER — Telehealth: Payer: Self-pay

## 2022-05-20 NOTE — Telephone Encounter (Signed)
   Name: Denise Hoffman  DOB: [birth year]  MRN: [id number]  Primary Cardiologist: Evalina Field, MD  Chart reviewed as part of pre-operative protocol coverage. This appears to be a duplicate request. The patient has an upcoming visit scheduled with Coletta Memos, NP on 05/21/2022 at which time clearance can be addressed in case there are any issues that would impact surgical recommendations. Preop coverage team, please notify requesting party. I will remove this message from the preop box as separate preop APP input not needed at this time.   Please call with any questions.  Lenna Sciara, NP  05/20/2022, 2:52 PM

## 2022-05-20 NOTE — Progress Notes (Addendum)
Cardiology Clinic Note   Patient Name: Denise Hoffman Date of Encounter: 05/21/2022  Primary Care Provider:  Hulan Fess, MD Primary Cardiologist:  Evalina Field, MD  Patient Profile    Denise Hoffman 86 year old female presents the clinic today for follow-up evaluation of her chest discomfort and preoperative cardiac evaluation.  Past Medical History    Past Medical History:  Diagnosis Date   Anxiety    Arthritis    knees and fingers   Chronic kidney disease    stage 3   Headache    Hernia    Hypertension    Hypothyroidism    Pancreatic cyst    Sciatica    Past Surgical History:  Procedure Laterality Date   CATARACT EXTRACTION  2022   COLONOSCOPY  01/30/2012   Procedure: COLONOSCOPY;  Surgeon: Inda Castle, MD;  Location: [facility] ENDOSCOPY;  Service: Endoscopy;  Laterality: N/A;   ESOPHAGOGASTRODUODENOSCOPY  01/28/2012   Procedure: ESOPHAGOGASTRODUODENOSCOPY (EGD);  Surgeon: Inda Castle, MD;  Location: [facility];  Service: Endoscopy;  Laterality: N/A;   FOOT SURGERY Right    pt thinks it was the right foot, unsure. Cyst removal   HAND SURGERY Left    HERNIA REPAIR     lvh with 12 cm parietex   WHIPPLE PROCEDURE     gall bladder removed with procedure    Allergies  Allergies  Allergen Reactions   Augmentin [Amoxicillin-Pot Clavulanate] Nausea Only   Biaxin [Clarithromycin] Nausea Only   Linzess [Linaclotide] Other (See Comments)    "Made stomach feel weird" (bloating/diarrhea)   Morphine Hives   Nsaids Other (See Comments)     Chronic Kidney Disease   Prednisone Nausea Only    With high doses=nauseous  Can tolerate lower doses   Sulfonamide Derivatives Other (See Comments)    Unknown reaction   Vioxx [Rofecoxib] Other (See Comments)    Unknown reaction.    History of Present Illness    Denise Hoffman has a PMH of hypertension, arthritis, chest discomfort, spinal stenosis, hypothyroidism, and coronary artery disease.   She underwent coronary calcium scoring 12/11/2020 which showed aortic atherosclerosis and coronary calcification LM and LAD.  Her coronary calcium score was 269 which places her in the 64th percentile for age and sex night control.  She was seen and evaluated by Dr. Audie Box on 11/01/2020.  During that time she reported episodes of chest discomfort that would start as pressure in her back and radiate to the center of her chest.  The symptoms would last for 10 to 20 seconds and resolved without intervention.  They were nonexertional.  Her blood pressure was noted to be 158/82.  Her amlodipine was increased to 5 mg daily.  After coronary calcium scoring it was recommended that she start a low-dose cholesterol-lowering medication and proceed to stress testing.  She presents to the clinic today for follow-up evaluation and preoperative cardiac evaluation.  She states continues to try to care for her husband.  She notes increasing back pain and sciatica.  She does cooking cleaning and all of her own shopping.  We reviewed her coronary calcium score and she expressed understanding.  Her blood pressure today is 100/58.  She reports good control at home.  We reviewed her upcoming surgery.  I will have her performed a stress test for further cardiac evaluation prior to proceeding with surgery.  She indicates that she is not sure whether she will proceed with surgery or not.  I will order  a BMP and plan follow-up in 9 to 12 months.  Addendum to original note.  She performed nuclear stress testing on 05/22/2022.  Testing showed low risk and no ischemia.  She may proceed to planned surgery without further testing.  Today she denies chest pain, shortness of breath, lower extremity edema, fatigue, palpitations, melena, hematuria, hemoptysis, diaphoresis, weakness, presyncope, syncope, orthopnea, and PND.   Home Medications    Prior to Admission medications   Medication Sig Start Date End Date Taking? Authorizing Provider   Alpha-Lipoic Acid 300 MG CAPS Take 600 mg by mouth daily in the afternoon. 02/20/22   [provider]  ALPRAZolam Duanne Moron) 0.5 MG tablet Take 0.5 mg by mouth 2 (two) times daily as needed for anxiety.    [provider]  amLODipine (NORVASC) 5 MG tablet Take 1 tablet (5 mg total) by mouth daily. PT  NEEDS  TO  SCHEDULE  AN  OFFICE  VISIT  FOR  FURTHER  REFILLS. 11/10/21   O'Neal, Cassie Freer, MD  estradiol (ESTRACE) 0.1 MG/GM vaginal cream Place 2.63 Applicatorfuls vaginally 2 (two) times a week.    [provider]  ferrous sulfate 325 (65 FE) MG tablet Take 325 mg by mouth in the morning.    [provider]  levothyroxine (SYNTHROID) 50 MCG tablet Take 50 mcg by mouth daily before breakfast. 02/07/20   [provider]  losartan (COZAAR) 100 MG tablet Take 100 mg by mouth in the morning.    [provider]  metoprolol tartrate (LOPRESSOR) 100 MG tablet Take 100 mg by mouth in the morning and at bedtime.    [provider]  nitrofurantoin, macrocrystal-monohydrate, (MACROBID) 100 MG capsule Take 100 mg by mouth 2 (two) times daily.    [provider]  Polyethyl Glycol-Propyl Glycol (LUBRICANT EYE DROPS) 0.4-0.3 % SOLN Place 1 drop into both eyes daily.    [provider]  TRIMO-SAN 0.025-0.01 % GEL Place 1 Application vaginally 2 (two) times a week. 02/05/20   [provider]    Family History    Family History  Problem Relation Age of Onset   Stroke Mother    Breast cancer Sister    Colon cancer Brother    She indicated that her mother is deceased. She indicated that her father is deceased. She indicated that the status of her sister is unknown. She indicated that the status of her brother is unknown.  Social History    Social History   Socioeconomic History   Marital status: Married    Spouse name: Not on file   Number of children: 1   Years of education: Not on file   Highest education level: Not  on file  Occupational History   Occupation: Retired    Fish farm manager: RETIRED  Tobacco Use   Smoking status: Never   Smokeless tobacco: Never  Vaping Use   Vaping Use: Never used  Substance and Sexual Activity   Alcohol use: No   Drug use: No   Sexual activity: Not on file  Other Topics Concern   Not on file  Social History Narrative   Not on file   Social Determinants of Health   Financial Resource Strain: Not on file  Food Insecurity: Not on file  Transportation Needs: Not on file  Physical Activity: Not on file  Stress: Not on file  Social Connections: Not on file  Intimate Partner Violence: Not on file     Review of Systems    General:  No chills, fever, night sweats or weight changes.  Cardiovascular:  No chest pain, dyspnea on exertion, edema, orthopnea, palpitations, paroxysmal nocturnal dyspnea. Dermatological: No rash, lesions/masses Respiratory: No cough, dyspnea Urologic: No hematuria, dysuria Abdominal:   No nausea, vomiting, diarrhea, bright red blood per rectum, melena, or hematemesis Neurologic:  No visual changes, wkns, changes in mental status. All other systems reviewed and are otherwise negative except as noted above.  Physical Exam    VS:  BP (!) 100/58 (BP Location: Left Arm, Patient Position: Sitting, Cuff Size: Normal)   Pulse 62   Ht '5\' 3"'$  (1.6 m)   Wt 137 lb 3.2 oz (62.2 kg)   SpO2 96%   BMI 24.30 kg/m  , BMI Body mass index is 24.3 kg/m. GEN: Well nourished, well developed, in no acute distress. HEENT: normal. Neck: Supple, no JVD, carotid bruits, or masses. Cardiac: RRR, no murmurs, rubs, or gallops. No clubbing, cyanosis, edema.  Radials/DP/PT 2+ and equal bilaterally.  Respiratory:  Respirations regular and unlabored, clear to auscultation bilaterally. GI: Soft, nontender, nondistended, BS + x 4. MS: no deformity or atrophy. Skin: warm and dry, no rash. Neuro:  Strength and sensation are intact. Psych: Normal affect.  Accessory  Clinical Findings    Recent Labs: 05/13/2022: BUN 12; Creatinine, Ser 0.77; Hemoglobin 13.3; Platelets 215; Potassium 4.6; Sodium 136   Recent Lipid Panel No results found for: "CHOL", "TRIG", "HDL", "CHOLHDL", "VLDL", "LDLCALC", "LDLDIRECT"  ECG personally reviewed by me today-none today.  Echocardiogram 05/24/2020  IMPRESSIONS     1. Left ventricular ejection fraction, by estimation, is 60 to 65%. The  left ventricle has normal function. The left ventricle has no regional  wall motion abnormalities. Left ventricular diastolic parameters are  consistent with Grade I diastolic  dysfunction (impaired relaxation).   2. Right ventricular systolic function is normal. The right ventricular  size is normal. Tricuspid regurgitation signal is inadequate for assessing  PA pressure.   3. The mitral valve is normal in structure. Mild mitral valve  regurgitation. No evidence of mitral stenosis.   4. The aortic valve is tricuspid. Aortic valve regurgitation is mild.  Mild aortic valve sclerosis is present, with no evidence of aortic valve  stenosis.   5. The inferior vena cava is normal in size with greater than 50%  respiratory variability, suggesting right atrial pressure of 3 mmHg.   Assessment & Plan   1.   Chest discomfort-no recent episodes of arm neck back or chest discomfort.  Underwent coronary calcium scoring 12/11/2020 which showed a coronary calcium score of 269 and placed her in the 64th percentile for age, sex matched control.  She was noted to have aortic atherosclerosis.  Low-dose cholesterol-lowering agent was recommended as well as proceeding to stress testing.  She is able to complete greater than 4 METS of physical activity. Heart healthy low-sodium diet Increase physical activity as tolerated Order nuclear stress test BMP  Shared Decision Making/Informed Consent The risks [chest pain, shortness of breath, cardiac arrhythmias, dizziness, blood pressure fluctuations,  myocardial infarction, stroke/transient ischemic attack, nausea, vomiting, allergic reaction, radiation exposure, metallic taste sensation and life-threatening complications (estimated to be 1 in 10,000)], benefits (risk stratification, diagnosing coronary artery disease, treatment guidance) and alternatives of a nuclear stress test were discussed in detail with Ms. Zurcher and she agrees to proceed.   Essential hypertension-BP today 100/58 Continue losartan, metoprolol, amlodipine Heart healthy low-sodium diet-salty 6 given Increase physical activity as tolerated  Preoperative cardiac evaluation-L5-S1 lumbar fusion, Dr. Dellis Filbert  Arnoldo Morale, 06/22/2022    Primary Cardiologist: Evalina Field, MD  Chart reviewed as part of pre-operative protocol coverage. Given past medical history and time since last visit, based on ACC/AHA guidelines, SKILA ROLLINS would be at acceptable risk for the planned procedure without further cardiovascular testing.   Patient was advised that if she develops new symptoms prior to surgery to contact our office to arrange a follow-up appointment.  He verbalized understanding.  Addendum to original note  She underwent nuclear stress testing on 05/22/2022.  Her test was low risk and showed no ischemia.  She may proceed with her planned surgery without any further testing at this time.      Disposition: Follow-up with Dr. Audie Box or me in 9-12 months.  Jossie Ng. Shron Ozer NP-C     05/21/2022, 9:08 AM [place] [address] Office [phone] Fax [phone]  Notice: This dictation was prepared with Dragon dictation along with smaller phrase technology. Any transcriptional errors that result from this process are unintentional and may not be corrected upon review.  I spent 14 minutes examining this patient, reviewing medications, and using patient centered shared decision making involving her cardiac care.  Prior to  her visit I spent greater than 20 minutes reviewing her past medical history,  medications, and prior cardiac tests.

## 2022-05-20 NOTE — Telephone Encounter (Signed)
   Pre-operative Risk Assessment    Patient Name: Denise Hoffman  DOB: October 04, 1936 MRN: [id number]      Request for Surgical Clearance    Procedure:   Lumbar Fusion  Date of Surgery:  Clearance 05/20/22                                 Surgeon:  Dr. Newman Pies Surgeon's Group or Practice Name:  [facility] Phone number:  [phone] UDA 370 Fax number:  [phone]   Type of Clearance Requested:   - Medical    Type of Anesthesia:  General    Additional requests/questions:  Please advise surgeon/provider what medications should be held.  Signed, Elsie Lincoln Binyamin Nelis   05/20/2022, 2:18 PM

## 2022-05-21 ENCOUNTER — Encounter: Payer: Self-pay | Admitting: General Practice

## 2022-05-21 ENCOUNTER — Telehealth (HOSPITAL_COMMUNITY): Payer: Self-pay | Admitting: *Deleted

## 2022-05-21 ENCOUNTER — Ambulatory Visit (INDEPENDENT_AMBULATORY_CARE_PROVIDER_SITE_OTHER): Payer: Medicare PPO | Admitting: General Practice

## 2022-05-21 VITALS — BP 100/58 | HR 62 | Ht 63.0 in | Wt 137.2 lb

## 2022-05-21 DIAGNOSIS — Z01810 Encounter for preprocedural cardiovascular examination: Secondary | ICD-10-CM | POA: Diagnosis not present

## 2022-05-21 DIAGNOSIS — R0789 Other chest pain: Secondary | ICD-10-CM

## 2022-05-21 DIAGNOSIS — I10 Essential (primary) hypertension: Secondary | ICD-10-CM

## 2022-05-21 LAB — BASIC METABOLIC PANEL
BUN/Creatinine Ratio: 19 (ref 12–28)
BUN: 16 mg/dL (ref 8–27)
CO2: 25 mmol/L (ref 20–29)
Calcium: 10.2 mg/dL (ref 8.7–10.3)
Chloride: 99 mmol/L (ref 96–106)
Creatinine, Ser: 0.83 mg/dL (ref 0.57–1.00)
Glucose: 78 mg/dL (ref 70–99)
Potassium: 5.1 mmol/L (ref 3.5–5.2)
Sodium: 137 mmol/L (ref 134–144)
eGFR: 69 mL/min/{1.73_m2} (ref >59)

## 2022-05-21 NOTE — Patient Instructions (Signed)
Medication Instructions:  The current medical regimen is effective;  continue present plan and medications as directed. Please refer to the Current Medication list given to you today.   *If you need a refill on your cardiac medications before your next appointment, please call your pharmacy*  Lab Work:    BMET TODAY     If you have labs (blood work) drawn today and your tests are completely normal, you will receive your results only by:  1-MyChart Message (if you have MyChart) OR  2-A paper copy in the mail.  If you have any lab test that is abnormal or we need to change your treatment, we will call you to review the results.  Testing/Procedures:  LEXISCAN=Your physician has requested that you have a [employer], is a chemical stress test. Please follow instruction sheet, as given.  The Myoview Stress Test is a diagnostic test to evaluate/detect the presence of early heart disease, to assess your functional capacity, or to update the status of your coronary circulation following a cardiac event.  Special Instructions NOT CLEARED FOR PROCEDURE-LUMBAR FUSION  Follow-Up: Your next appointment:  9-12 month(s) In Person with Evalina Field, MD  or Coletta Memos, FNP      Please call our office 2 months in advance to schedule this appointment   At [facility], you and your health needs are our priority.  As part of our continuing mission to provide you with exceptional heart care, we have created designated Provider Care Teams.  These Care Teams include your primary Cardiologist (physician) and Advanced Practice Providers (APPs -  Physician Assistants and Nurse Practitioners) who all work together to provide you with the care you need, when you need it.  Important Information About [employer]

## 2022-05-21 NOTE — Telephone Encounter (Signed)
Close encounter 

## 2022-05-22 ENCOUNTER — Ambulatory Visit (HOSPITAL_COMMUNITY)
Admission: RE | Admit: 2022-05-22 | Discharge: 2022-05-22 | Disposition: A | Discharge status: Home / Self Care | Payer: Medicare PPO | Source: Ambulatory Visit | Attending: General Practice | Admitting: General Practice

## 2022-05-22 DIAGNOSIS — Z01810 Encounter for preprocedural cardiovascular examination: Secondary | ICD-10-CM

## 2022-05-22 LAB — MYOCARDIAL PERFUSION IMAGING
LV dias vol: 70 mL (ref 46–106)
LV sys vol: 22 mL
Nuc Stress EF: 68 %
Peak HR: 86 {beats}/min
Rest HR: 58 {beats}/min
Rest Nuclear Isotope Dose: 10.7 mCi
SDS: 0
SRS: 0
SSS: 0
ST Depression (mm): 0 mm
Stress Nuclear Isotope Dose: 30.1 mCi
TID: 0.95

## 2022-05-22 MED ORDER — REGADENOSON 0.4 MG/5ML IV SOLN
0.4000 mg | Freq: Once | INTRAVENOUS | Status: AC
  Administered 2022-05-22: 0.4 mg via INTRAVENOUS

## 2022-05-22 MED ORDER — TECHNETIUM TC 99M TETROFOSMIN IV KIT
10.7000 | PACK | Freq: Once | INTRAVENOUS | Status: AC | PRN
  Administered 2022-05-22: 10.7 via INTRAVENOUS

## 2022-05-22 MED ORDER — TECHNETIUM TC 99M TETROFOSMIN IV KIT
30.1000 | PACK | Freq: Once | INTRAVENOUS | Status: AC | PRN
  Administered 2022-05-22: 30.1 via INTRAVENOUS

## 2022-05-25 DIAGNOSIS — M79671 Pain in right foot: Secondary | ICD-10-CM | POA: Diagnosis not present

## 2022-05-25 DIAGNOSIS — M79672 Pain in left foot: Secondary | ICD-10-CM | POA: Diagnosis not present

## 2022-05-27 ENCOUNTER — Telehealth: Payer: Self-pay | Admitting: Cardiovascular Disease

## 2022-05-27 NOTE — Telephone Encounter (Signed)
Pt is returning call in regards to results. Requesting call back.  

## 2022-05-27 NOTE — Telephone Encounter (Signed)
Patient informed fo notation of Lexiscan from J. Marilynn Rail, FNP: "Please contact Ms. Kister and let her know that her stress test has been reviewed.  Her study was normal.  It is a low risk. There is no evidence of ischemia.  No further testing is needed at this time.  Thank you." Patient had no other questions or concerns at this time.

## 2022-05-28 ENCOUNTER — Other Ambulatory Visit: Payer: Self-pay | Admitting: Neurosurgery

## 2022-06-22 ENCOUNTER — Ambulatory Visit (HOSPITAL_COMMUNITY): Admission: RE | Admit: 2022-06-22 | Payer: Medicare PPO | Source: Home / Self Care | Admitting: Neurosurgery

## 2022-06-22 DIAGNOSIS — M7071 Other bursitis of hip, right hip: Secondary | ICD-10-CM | POA: Diagnosis not present

## 2022-06-22 SURGERY — POSTERIOR LUMBAR FUSION 1 LEVEL
Anesthesia: General

## 2022-06-24 ENCOUNTER — Other Ambulatory Visit: Payer: Self-pay | Admitting: Anesthesiology

## 2022-06-24 ENCOUNTER — Ambulatory Visit
Admission: RE | Admit: 2022-06-24 | Discharge: 2022-06-24 | Disposition: A | Discharge status: Home / Self Care | Payer: Medicare PPO | Source: Ambulatory Visit | Attending: Anesthesiology | Admitting: Anesthesiology

## 2022-06-24 DIAGNOSIS — G894 Chronic pain syndrome: Secondary | ICD-10-CM | POA: Diagnosis not present

## 2022-06-24 DIAGNOSIS — M4727 Other spondylosis with radiculopathy, lumbosacral region: Secondary | ICD-10-CM | POA: Diagnosis not present

## 2022-06-24 DIAGNOSIS — M25551 Pain in right hip: Secondary | ICD-10-CM | POA: Diagnosis not present

## 2022-06-24 DIAGNOSIS — M4317 Spondylolisthesis, lumbosacral region: Secondary | ICD-10-CM | POA: Diagnosis not present

## 2022-07-02 DIAGNOSIS — M1611 Unilateral primary osteoarthritis, right hip: Secondary | ICD-10-CM | POA: Diagnosis not present

## 2022-07-02 DIAGNOSIS — M25561 Pain in right knee: Secondary | ICD-10-CM | POA: Diagnosis not present

## 2022-07-02 DIAGNOSIS — M25551 Pain in right hip: Secondary | ICD-10-CM | POA: Diagnosis not present

## 2022-07-17 DIAGNOSIS — N8110 Cystocele, unspecified: Secondary | ICD-10-CM | POA: Diagnosis not present

## 2022-07-17 DIAGNOSIS — N390 Urinary tract infection, site not specified: Secondary | ICD-10-CM | POA: Diagnosis not present

## 2022-07-17 DIAGNOSIS — R350 Frequency of micturition: Secondary | ICD-10-CM | POA: Diagnosis not present

## 2022-07-17 DIAGNOSIS — Z4689 Encounter for fitting and adjustment of other specified devices: Secondary | ICD-10-CM | POA: Diagnosis not present

## 2022-07-22 ENCOUNTER — Other Ambulatory Visit: Payer: Self-pay | Admitting: Orthopedic Surgery

## 2022-07-22 DIAGNOSIS — M25551 Pain in right hip: Secondary | ICD-10-CM

## 2022-07-29 DIAGNOSIS — M4727 Other spondylosis with radiculopathy, lumbosacral region: Secondary | ICD-10-CM | POA: Diagnosis not present

## 2022-07-29 DIAGNOSIS — M4317 Spondylolisthesis, lumbosacral region: Secondary | ICD-10-CM | POA: Diagnosis not present

## 2022-07-29 DIAGNOSIS — G894 Chronic pain syndrome: Secondary | ICD-10-CM | POA: Diagnosis not present

## 2022-07-29 DIAGNOSIS — M25551 Pain in right hip: Secondary | ICD-10-CM | POA: Diagnosis not present

## 2022-08-03 ENCOUNTER — Ambulatory Visit
Admission: RE | Admit: 2022-08-03 | Discharge: 2022-08-03 | Disposition: A | Discharge status: Home / Self Care | Payer: Medicare PPO | Source: Ambulatory Visit | Attending: Orthopedic Surgery | Admitting: Orthopedic Surgery

## 2022-08-03 DIAGNOSIS — M25551 Pain in right hip: Secondary | ICD-10-CM

## 2022-08-03 DIAGNOSIS — M1611 Unilateral primary osteoarthritis, right hip: Secondary | ICD-10-CM | POA: Diagnosis not present

## 2022-08-03 DIAGNOSIS — S73191A Other sprain of right hip, initial encounter: Secondary | ICD-10-CM | POA: Diagnosis not present

## 2022-08-03 DIAGNOSIS — M24151 Other articular cartilage disorders, right hip: Secondary | ICD-10-CM | POA: Diagnosis not present

## 2022-08-03 DIAGNOSIS — M79604 Pain in right leg: Secondary | ICD-10-CM | POA: Diagnosis not present

## 2022-08-05 ENCOUNTER — Other Ambulatory Visit: Payer: Medicare PPO

## 2022-08-09 ENCOUNTER — Other Ambulatory Visit: Payer: Medicare PPO

## 2022-08-13 DIAGNOSIS — M1611 Unilateral primary osteoarthritis, right hip: Secondary | ICD-10-CM | POA: Diagnosis not present

## 2022-09-03 ENCOUNTER — Telehealth: Payer: Self-pay

## 2022-09-03 ENCOUNTER — Telehealth: Payer: Self-pay | Admitting: Cardiovascular Disease

## 2022-09-03 NOTE — Telephone Encounter (Signed)
Follow Up:     Wells Guiles is calling to check on the status of patient's clearance that was sent on 08-24-22. Please fax this to [phone].

## 2022-09-03 NOTE — Telephone Encounter (Signed)
   Pre-operative Risk Assessment    Patient Name: Denise Hoffman  DOB: 10-26-1935 MRN: [id number]      Request for Surgical Clearance    Procedure:   Right anterior hip arthroplasty   Date of Surgery:  Clearance TBD                                 Surgeon:  Dr. Frederik Pear  Surgeon's Group or Practice Name:  [facility]  Phone number:  [phone] Fax number:  [phone]   Type of Clearance Requested:   - Medical    Type of Anesthesia:  Spinal   Additional requests/questions:    SignedJacqulynn Cadet   09/03/2022, 5:25 PM

## 2022-09-04 ENCOUNTER — Telehealth: Payer: Self-pay | Admitting: *Deleted

## 2022-09-04 NOTE — Telephone Encounter (Signed)
   Name: Denise Hoffman  DOB: November 22, 1935  MRN: [id number]  Primary Cardiologist: Evalina Field, MD   Preoperative team, please contact this patient and set up a phone call appointment for further preoperative risk assessment. Please obtain consent and complete medication review. Thank you for your help.  I confirm that guidance regarding antiplatelet and oral anticoagulation therapy has been completed and, if necessary, noted below.(None requested)   Deberah Pelton, NP 09/04/2022, 9:21 AM [place]

## 2022-09-04 NOTE — Telephone Encounter (Signed)
[name] and she has been scheduled for a tele pre op appt 09/14/22 @ 2:20 ion hopes that he surgery may be moved up sooner than anticipated 10/2022. Med rec and consent are done.     Patient Consent for Virtual Visit        Denise Hoffman has provided verbal consent on 09/04/2022 for a virtual visit (video or telephone).   CONSENT FOR VIRTUAL VISIT FOR:  Denise Hoffman  By participating in this virtual visit I agree to the following:  I hereby voluntarily request, consent and authorize [facility] and its employed or contracted physicians, physician assistants, nurse practitioners or other licensed health care professionals (the Practitioner), to provide me with telemedicine health care services (the "Services") as deemed necessary by the treating Practitioner. I acknowledge and consent to receive the Services by the Practitioner via telemedicine. I understand that the telemedicine visit will involve communicating with the Practitioner through live audiovisual communication technology and the disclosure of certain medical information by electronic transmission. I acknowledge that I have been given the opportunity to request an in-person assessment or other available alternative prior to the telemedicine visit and am voluntarily participating in the telemedicine visit.  I understand that I have the right to withhold or withdraw my consent to the use of telemedicine in the course of my care at any time, without affecting my right to future care or treatment, and that the Practitioner or I may terminate the telemedicine visit at any time. I understand that I have the right to inspect all information obtained and/or recorded in the course of the telemedicine visit and may receive copies of available information for a reasonable fee.  I understand that some of the potential risks of receiving the Services via telemedicine include:  Delay or interruption in medical evaluation due  to technological equipment failure or disruption; Information transmitted may not be sufficient (e.g. poor resolution of images) to allow for appropriate medical decision making by the Practitioner; and/or  In rare instances, security protocols could fail, causing a breach of personal health information.  Furthermore, I acknowledge that it is my responsibility to provide information about my medical history, conditions and care that is complete and accurate to the best of my ability. I acknowledge that Practitioner's advice, recommendations, and/or decision may be based on factors not within their control, such as incomplete or inaccurate data provided by me or distortions of diagnostic images or specimens that may result from electronic transmissions. I understand that the practice of medicine is not an exact science and that Practitioner makes no warranties or guarantees regarding treatment outcomes. I acknowledge that a copy of this consent can be made available to me via my patient portal ([facility]), or I can request a printed copy by calling the office of [facility].    I understand that my insurance will be billed for this visit.   I have read or had this consent read to me. I understand the contents of this consent, which adequately explains the benefits and risks of the Services being provided via telemedicine.  I have been provided ample opportunity to ask questions regarding this consent and the Services and have had my questions answered to my satisfaction. I give my informed consent for the services to be provided through the use of telemedicine in my medical care

## 2022-09-04 NOTE — Telephone Encounter (Signed)
[name] and she has been scheduled for a tele pre op appt 09/14/22 @ 2:20 ion hopes that he surgery may be moved up sooner than anticipated 10/2022. Med rec and consent are done.

## 2022-09-14 ENCOUNTER — Ambulatory Visit: Payer: Medicare PPO

## 2022-09-14 DIAGNOSIS — F419 Anxiety disorder, unspecified: Secondary | ICD-10-CM | POA: Diagnosis not present

## 2022-09-14 DIAGNOSIS — N390 Urinary tract infection, site not specified: Secondary | ICD-10-CM | POA: Diagnosis not present

## 2022-09-14 DIAGNOSIS — I10 Essential (primary) hypertension: Secondary | ICD-10-CM | POA: Diagnosis not present

## 2022-09-14 DIAGNOSIS — Z6825 Body mass index (BMI) 25.0-25.9, adult: Secondary | ICD-10-CM | POA: Diagnosis not present

## 2022-09-14 DIAGNOSIS — M810 Age-related osteoporosis without current pathological fracture: Secondary | ICD-10-CM | POA: Diagnosis not present

## 2022-09-14 DIAGNOSIS — N1831 Chronic kidney disease, stage 3a: Secondary | ICD-10-CM | POA: Diagnosis not present

## 2022-09-14 DIAGNOSIS — R300 Dysuria: Secondary | ICD-10-CM | POA: Diagnosis not present

## 2022-09-14 NOTE — Telephone Encounter (Signed)
S/w the pt and she has been rescheduled to 09/17/22 due provider not available today. Pt thanked me for the call and the help.

## 2022-09-16 NOTE — Progress Notes (Unsigned)
Virtual Visit via Telephone Note   Because of Denise Hoffman's co-morbid illnesses, she is at least at moderate risk for complications without adequate follow up.  This format is felt to be most appropriate for this patient at this time.  The patient did not have access to video technology/had technical difficulties with video requiring transitioning to audio format only (telephone).  All issues noted in this document were discussed and addressed.  No physical exam could be performed with this format.  Please refer to the patient's chart for her consent to telehealth for Denise Hoffman.  Evaluation Performed:  Preoperative cardiovascular risk assessment _____________   Date:  09/16/2022   Patient ID:  Denise Hoffman, DOB 05/18/1936, MRN [id number] Patient Location:  Home Provider location:   Office  Primary Care Provider:  Hulan Fess, MD Primary Cardiologist:  Denise Field, MD  Chief Complaint / Patient Profile   86 y.o. y/o female with a h/o hypertension, chest pain, hypothyroidism, CAD with coronary calcium score 12/2020 which showed aortic atherosclerosis and calcification of LM and LAD with calcium score of 28, who underwent nuclear stress test 05/22/2022 which was low risk with no ischemia who is pending right anterior hip arthroplasty and presents today for telephonic preoperative cardiovascular risk assessment.  History of Present Illness    Denise Hoffman is a 86 y.o. female who presents via audio/video conferencing for a telehealth visit today.  Pt was last seen in cardiology clinic on 05/21/22 by Denise Hoffman.  At that time Denise Hoffman was seeking cardiac clearance for upcoming surgery and had normal nuclear stress test.  The patient is now pending procedure as outlined above. Since her last visit, she  denies chest pain, shortness of breath, lower extremity edema, fatigue, palpitations, melena, hematuria, hemoptysis, diaphoresis,  weakness, presyncope, syncope, orthopnea, and PND. Continues to do all of her own house work. Previously was walking for exercise and doing yard work but has been limited for the past 4 months due to hip pain. Can still achieve > 4 METS activity without concerning cardiac symptoms.   Past Medical History    Past Medical History:  Diagnosis Date   Anxiety    Arthritis    knees and fingers   Chronic kidney disease    stage 3   Headache    Hernia    Hypertension    Hypothyroidism    Pancreatic cyst    Sciatica    Past Surgical History:  Procedure Laterality Date   CATARACT EXTRACTION  2022   COLONOSCOPY  01/30/2012   Procedure: COLONOSCOPY;  Surgeon: Denise Castle, MD;  Location: [facility];  Service: Endoscopy;  Laterality: N/A;   ESOPHAGOGASTRODUODENOSCOPY  01/28/2012   Procedure: ESOPHAGOGASTRODUODENOSCOPY (EGD);  Surgeon: Denise Castle, MD;  Location: [facility];  Service: Endoscopy;  Laterality: N/A;   FOOT SURGERY Right    pt thinks it was the right foot, unsure. Cyst removal   HAND SURGERY Left    HERNIA REPAIR     lvh with 12 cm parietex   WHIPPLE PROCEDURE     gall bladder removed with procedure    Allergies  Allergies  Allergen Reactions   Augmentin [Amoxicillin-Pot Clavulanate] Nausea Only   Biaxin [Clarithromycin] Nausea Only   Linzess [Linaclotide] Other (See Comments)    "Made stomach feel weird" (bloating/diarrhea)   Morphine Hives   Nsaids Other (See Comments)     Chronic Kidney Disease   Prednisone Nausea Only  With high doses=nauseous  Can tolerate lower doses   Sulfonamide Derivatives Other (See Comments)    Unknown reaction   Vioxx [Rofecoxib] Other (See Comments)    Unknown reaction.    Home Medications    Prior to Admission medications   Medication Sig Start Date End Date Taking? Authorizing Provider  Alpha-Lipoic Acid 300 MG CAPS Take 600 mg by mouth daily in the afternoon. Patient not taking: Reported on 05/21/2022 02/20/22    [provider]  ALPRAZolam Duanne Moron) 0.5 MG tablet Take 0.5 mg by mouth 2 (two) times daily as needed for anxiety.    [provider]  amLODipine (NORVASC) 5 MG tablet Take 1 tablet (5 mg total) by mouth daily. PT  NEEDS  TO  SCHEDULE  AN  OFFICE  VISIT  FOR  FURTHER  REFILLS. 11/10/21   O'Neal, Cassie Freer, MD  estradiol (ESTRACE) 0.1 MG/GM vaginal cream Place 6.50 Applicatorfuls vaginally 2 (two) times a week.    [provider]  ferrous sulfate 325 (65 FE) MG tablet Take 325 mg by mouth in the morning. Patient not taking: Reported on 05/21/2022    [provider]  levothyroxine (SYNTHROID) 50 MCG tablet Take 50 mcg by mouth daily before breakfast. 02/07/20   [provider]  losartan (COZAAR) 100 MG tablet Take 100 mg by mouth in the morning.    [provider]  metoprolol tartrate (LOPRESSOR) 100 MG tablet Take 100 mg by mouth in the morning and at bedtime.    [provider]  Polyethyl Glycol-Propyl Glycol (LUBRICANT EYE DROPS) 0.4-0.3 % SOLN Place 1 drop into both eyes daily.    [provider]  TRIMO-SAN 0.025-0.01 % GEL Place 1 Application vaginally 2 (two) times a week. 02/05/20   [provider]    Physical Exam    Vital Signs:  Denise Hoffman does not have vital signs available for review today.  Given telephonic nature of communication, physical exam is limited. AAOx3. NAD. Normal affect.  Speech and respirations are unlabored.  Accessory Clinical Findings    None  Assessment & Plan    1.  Preoperative Cardiovascular Risk Assessment: The patient is doing well from a cardiac perspective. Therefore, based on ACC/AHA guidelines, the patient would be at acceptable risk for the planned procedure without further cardiovascular testing.  According to the Revised Cardiac Risk Index (RCRI), her Perioperative Risk of Major Cardiac Event is (%): 0.9. Her Functional Capacity in METs is: 6.61 according to the  [employer] Activity Status Index (DASI).  The patient was advised that if she develops new symptoms prior to surgery to contact our office to arrange for a follow-up visit, and she verbalized understanding.  A copy of this note will be routed to requesting surgeon.  Time:   Today, I have spent 8 minutes with the patient with telehealth technology discussing medical history, symptoms, and management plan.     Emmaline Life, Hoffman-C  09/17/2022, 2:19 PM [address] Office [phone] Fax [phone]

## 2022-09-17 ENCOUNTER — Encounter: Payer: Self-pay | Admitting: Nurse Practitioner

## 2022-09-17 ENCOUNTER — Ambulatory Visit: Payer: Medicare PPO | Attending: Internal Medicine | Admitting: Nurse Practitioner

## 2022-09-17 DIAGNOSIS — Z01810 Encounter for preprocedural cardiovascular examination: Secondary | ICD-10-CM

## 2022-09-23 DIAGNOSIS — Z961 Presence of intraocular lens: Secondary | ICD-10-CM | POA: Diagnosis not present

## 2022-09-23 DIAGNOSIS — H04123 Dry eye syndrome of bilateral lacrimal glands: Secondary | ICD-10-CM | POA: Diagnosis not present

## 2022-10-08 DIAGNOSIS — Z4689 Encounter for fitting and adjustment of other specified devices: Secondary | ICD-10-CM | POA: Diagnosis not present

## 2022-10-08 DIAGNOSIS — M810 Age-related osteoporosis without current pathological fracture: Secondary | ICD-10-CM | POA: Diagnosis not present

## 2022-10-20 ENCOUNTER — Ambulatory Visit
Admission: RE | Admit: 2022-10-20 | Discharge: 2022-10-20 | Disposition: A | Discharge status: Home / Self Care | Payer: Medicare PPO | Source: Ambulatory Visit | Attending: Obstetrics | Admitting: Obstetrics

## 2022-10-20 DIAGNOSIS — Z780 Asymptomatic menopausal state: Secondary | ICD-10-CM | POA: Diagnosis not present

## 2022-10-20 DIAGNOSIS — M85851 Other specified disorders of bone density and structure, right thigh: Secondary | ICD-10-CM | POA: Diagnosis not present

## 2022-10-20 DIAGNOSIS — M810 Age-related osteoporosis without current pathological fracture: Secondary | ICD-10-CM

## 2022-11-03 ENCOUNTER — Other Ambulatory Visit: Payer: Self-pay | Admitting: Cardiovascular Disease

## 2022-11-18 DIAGNOSIS — I700 Atherosclerosis of aorta: Secondary | ICD-10-CM | POA: Diagnosis not present

## 2022-11-18 DIAGNOSIS — R059 Cough, unspecified: Secondary | ICD-10-CM | POA: Diagnosis not present

## 2022-11-18 DIAGNOSIS — N1831 Chronic kidney disease, stage 3a: Secondary | ICD-10-CM | POA: Diagnosis not present

## 2022-11-18 DIAGNOSIS — Z20822 Contact with and (suspected) exposure to covid-19: Secondary | ICD-10-CM | POA: Diagnosis not present

## 2022-11-18 DIAGNOSIS — J320 Chronic maxillary sinusitis: Secondary | ICD-10-CM | POA: Diagnosis not present

## 2022-11-18 DIAGNOSIS — J321 Chronic frontal sinusitis: Secondary | ICD-10-CM | POA: Diagnosis not present

## 2022-12-18 DIAGNOSIS — Z03818 Encounter for observation for suspected exposure to other biological agents ruled out: Secondary | ICD-10-CM | POA: Diagnosis not present

## 2022-12-18 DIAGNOSIS — R051 Acute cough: Secondary | ICD-10-CM | POA: Diagnosis not present

## 2022-12-18 DIAGNOSIS — J069 Acute upper respiratory infection, unspecified: Secondary | ICD-10-CM | POA: Diagnosis not present

## 2023-01-11 DIAGNOSIS — Z4689 Encounter for fitting and adjustment of other specified devices: Secondary | ICD-10-CM | POA: Diagnosis not present

## 2023-01-11 DIAGNOSIS — Z1231 Encounter for screening mammogram for malignant neoplasm of breast: Secondary | ICD-10-CM | POA: Diagnosis not present

## 2023-01-11 DIAGNOSIS — N8110 Cystocele, unspecified: Secondary | ICD-10-CM | POA: Diagnosis not present

## 2023-01-11 DIAGNOSIS — M810 Age-related osteoporosis without current pathological fracture: Secondary | ICD-10-CM | POA: Diagnosis not present

## 2023-02-02 DIAGNOSIS — I10 Essential (primary) hypertension: Secondary | ICD-10-CM | POA: Diagnosis not present

## 2023-02-02 DIAGNOSIS — S70361A Insect bite (nonvenomous), right thigh, initial encounter: Secondary | ICD-10-CM | POA: Diagnosis not present

## 2023-02-02 DIAGNOSIS — G629 Polyneuropathy, unspecified: Secondary | ICD-10-CM | POA: Diagnosis not present

## 2023-02-02 DIAGNOSIS — W57XXXA Bitten or stung by nonvenomous insect and other nonvenomous arthropods, initial encounter: Secondary | ICD-10-CM | POA: Diagnosis not present

## 2023-02-04 ENCOUNTER — Encounter: Payer: Self-pay | Admitting: Neurology

## 2023-02-18 DIAGNOSIS — M25551 Pain in right hip: Secondary | ICD-10-CM | POA: Diagnosis not present

## 2023-02-18 DIAGNOSIS — M4317 Spondylolisthesis, lumbosacral region: Secondary | ICD-10-CM | POA: Diagnosis not present

## 2023-02-18 DIAGNOSIS — G894 Chronic pain syndrome: Secondary | ICD-10-CM | POA: Diagnosis not present

## 2023-02-18 DIAGNOSIS — M4727 Other spondylosis with radiculopathy, lumbosacral region: Secondary | ICD-10-CM | POA: Diagnosis not present

## 2023-02-23 ENCOUNTER — Ambulatory Visit
Admission: RE | Admit: 2023-02-23 | Discharge: 2023-02-23 | Disposition: A | Discharge status: Home / Self Care | Payer: Medicare PPO | Source: Ambulatory Visit | Attending: Family Medicine | Admitting: Family Medicine

## 2023-02-23 ENCOUNTER — Other Ambulatory Visit: Payer: Self-pay | Admitting: Family Medicine

## 2023-02-23 DIAGNOSIS — R109 Unspecified abdominal pain: Secondary | ICD-10-CM | POA: Diagnosis not present

## 2023-02-23 DIAGNOSIS — R0781 Pleurodynia: Secondary | ICD-10-CM | POA: Diagnosis not present

## 2023-03-15 ENCOUNTER — Ambulatory Visit: Payer: Medicare PPO | Admitting: Neurology

## 2023-03-21 ENCOUNTER — Emergency Department (HOSPITAL_BASED_OUTPATIENT_CLINIC_OR_DEPARTMENT_OTHER)
Admission: EM | Admit: 2023-03-21 | Discharge: 2023-03-21 | Disposition: A | Discharge status: Home / Self Care | Payer: Medicare PPO | Attending: Emergency Medicine | Admitting: Emergency Medicine

## 2023-03-21 ENCOUNTER — Other Ambulatory Visit: Payer: Self-pay

## 2023-03-21 ENCOUNTER — Encounter (HOSPITAL_BASED_OUTPATIENT_CLINIC_OR_DEPARTMENT_OTHER): Payer: Self-pay

## 2023-03-21 ENCOUNTER — Emergency Department (HOSPITAL_BASED_OUTPATIENT_CLINIC_OR_DEPARTMENT_OTHER): Payer: Medicare PPO | Admitting: Radiology

## 2023-03-21 DIAGNOSIS — I129 Hypertensive chronic kidney disease with stage 1 through stage 4 chronic kidney disease, or unspecified chronic kidney disease: Secondary | ICD-10-CM | POA: Insufficient documentation

## 2023-03-21 DIAGNOSIS — R1013 Epigastric pain: Secondary | ICD-10-CM | POA: Diagnosis not present

## 2023-03-21 DIAGNOSIS — E039 Hypothyroidism, unspecified: Secondary | ICD-10-CM | POA: Diagnosis not present

## 2023-03-21 DIAGNOSIS — R110 Nausea: Secondary | ICD-10-CM | POA: Diagnosis not present

## 2023-03-21 DIAGNOSIS — N189 Chronic kidney disease, unspecified: Secondary | ICD-10-CM | POA: Diagnosis not present

## 2023-03-21 DIAGNOSIS — R0789 Other chest pain: Secondary | ICD-10-CM | POA: Insufficient documentation

## 2023-03-21 DIAGNOSIS — R079 Chest pain, unspecified: Secondary | ICD-10-CM | POA: Diagnosis not present

## 2023-03-21 DIAGNOSIS — N390 Urinary tract infection, site not specified: Secondary | ICD-10-CM | POA: Diagnosis not present

## 2023-03-21 DIAGNOSIS — R519 Headache, unspecified: Secondary | ICD-10-CM | POA: Diagnosis not present

## 2023-03-21 DIAGNOSIS — R030 Elevated blood-pressure reading, without diagnosis of hypertension: Secondary | ICD-10-CM | POA: Diagnosis not present

## 2023-03-21 DIAGNOSIS — R42 Dizziness and giddiness: Secondary | ICD-10-CM | POA: Diagnosis not present

## 2023-03-21 LAB — BASIC METABOLIC PANEL
Anion gap: 8 (ref 5–15)
BUN: 17 mg/dL (ref 8–23)
CO2: 27 mmol/L (ref 22–32)
Calcium: 9.6 mg/dL (ref 8.9–10.3)
Chloride: 93 mmol/L — ABNORMAL LOW (ref 98–111)
Creatinine, Ser: 1.02 mg/dL — ABNORMAL HIGH (ref 0.44–1.00)
GFR, Estimated: 54 mL/min — ABNORMAL LOW (ref >60)
Glucose, Bld: 110 mg/dL — ABNORMAL HIGH (ref 70–99)
Potassium: 4 mmol/L (ref 3.5–5.1)
Sodium: 128 mmol/L — ABNORMAL LOW (ref 135–145)

## 2023-03-21 LAB — CBC
HCT: 36 % (ref 36.0–46.0)
Hemoglobin: 12.3 g/dL (ref 12.0–15.0)
MCH: 30.9 pg (ref 26.0–34.0)
MCHC: 34.2 g/dL (ref 30.0–36.0)
MCV: 90.5 fL (ref 80.0–100.0)
Platelets: 211 10*3/uL (ref 150–400)
RBC: 3.98 MIL/uL (ref 3.87–5.11)
RDW: 14 % (ref 11.5–15.5)
WBC: 15.1 10*3/uL — ABNORMAL HIGH (ref 4.0–10.5)
nRBC: 0 % (ref 0.0–0.2)

## 2023-03-21 LAB — TROPONIN I (HIGH SENSITIVITY)
Troponin I (High Sensitivity): 5 ng/L (ref <18)
Troponin I (High Sensitivity): 5 ng/L (ref <18)

## 2023-03-21 MED ORDER — ONDANSETRON 4 MG PO TBDP: 4.0000 mg | ORAL_TABLET | Freq: Three times a day (TID) | ORAL | 0 refills | Status: AC | PRN

## 2023-03-21 NOTE — ED Triage Notes (Signed)
Sent from UC to evaluate epigastric pain.  Pain onset 2 days ago  associated with nausea and high BP.  States does had GERDS

## 2023-03-21 NOTE — ED Provider Notes (Signed)
[place] EMERGENCY DEPARTMENT AT [facility] Provider Note   CSN: [id number] Arrival date & time: 03/21/23  1435     History  Chief Complaint  Patient presents with   Chest Pain    Denise Hoffman is a 87 y.o. female.  The history is provided by the patient, a relative and medical records. No language interpreter was used.  Chest Pain Pain location:  Substernal area Pain quality: burning and dull   Pain radiates to:  Epigastrium Pain severity:  Mild Onset quality:  Gradual Duration:  2 days Timing:  Constant Progression:  Unchanged Chronicity:  New Worsened by:  Nothing Ineffective treatments:  None tried Associated symptoms: abdominal pain and nausea   Associated symptoms: no back pain, no cough, no fatigue, no fever, no headache, no near-syncope, no palpitations, no shortness of breath and no vomiting        Home Medications Prior to Admission medications   Medication Sig Start Date End Date Taking? Authorizing Provider  Alpha-Lipoic Acid 300 MG CAPS Take 600 mg by mouth daily in the afternoon. Patient not taking: Reported on 05/21/2022 02/20/22   [provider]  ALPRAZolam Prudy Feeler) 0.5 MG tablet Take 0.5 mg by mouth 2 (two) times daily as needed for anxiety.    [provider]  amLODipine (NORVASC) 5 MG tablet TAKE 1 TABLET BY MOUTH DAILY. PT NEEDS TO SCHEDULE AN OFFICE VISIT FOR FURTHER REFILLS. 11/04/22   O'NealRonnald Ramp, MD  estradiol (ESTRACE) 0.1 MG/GM vaginal cream Place 0.25 Applicatorfuls vaginally 2 (two) times a week.    [provider]  ferrous sulfate 325 (65 FE) MG tablet Take 325 mg by mouth in the morning. Patient not taking: Reported on 05/21/2022    [provider]  levothyroxine (SYNTHROID) 50 MCG tablet Take 50 mcg by mouth daily before breakfast. 02/07/20   [provider]  losartan (COZAAR) 100 MG tablet Take 100 mg by mouth in the morning.    [provider]  metoprolol  tartrate (LOPRESSOR) 100 MG tablet Take 100 mg by mouth in the morning and at bedtime.    [provider]  Polyethyl Glycol-Propyl Glycol (LUBRICANT EYE DROPS) 0.4-0.3 % SOLN Place 1 drop into both eyes daily.    [provider]  TRIMO-SAN 0.025-0.01 % GEL Place 1 Application vaginally 2 (two) times a week. 02/05/20   [provider]      Allergies    Augmentin [amoxicillin-pot clavulanate], Biaxin [clarithromycin], Linzess [linaclotide], Morphine, Nsaids, Prednisone, Sulfonamide derivatives, and Vioxx [rofecoxib]    Review of Systems   Review of Systems  Constitutional:  Negative for chills, fatigue and fever.  HENT:  Negative for congestion.   Respiratory:  Negative for cough, chest tightness, shortness of breath and wheezing.   Cardiovascular:  Positive for chest pain. Negative for palpitations and near-syncope.  Gastrointestinal:  Positive for abdominal pain and nausea. Negative for constipation, diarrhea and vomiting.  Genitourinary:  Negative for dysuria.  Musculoskeletal:  Negative for back pain, neck pain and neck stiffness.  Skin:  Negative for rash and wound.  Neurological:  Negative for light-headedness and headaches.  Psychiatric/Behavioral:  Negative for agitation and confusion.   All other systems reviewed and are negative.   Physical Exam Updated Vital Signs BP (!) 145/61   Pulse 71   Resp 19   Ht 5\' 3"  (1.6 m)   Wt 63 kg   SpO2 93%   BMI 24.62 kg/m  Physical Exam Vitals and nursing note reviewed.  Constitutional:      General: She is not in acute distress.    Appearance: She is well-developed. She is not ill-appearing, toxic-appearing or diaphoretic.  HENT:     Head: Normocephalic and atraumatic.  Eyes:     Conjunctiva/sclera: Conjunctivae normal.     Pupils: Pupils are equal, round, and reactive to light.  Cardiovascular:     Rate and Rhythm: Normal rate and regular rhythm.     Heart sounds: Normal heart sounds. No murmur  heard. Pulmonary:     Effort: Pulmonary effort is normal. No respiratory distress.     Breath sounds: Normal breath sounds. No wheezing, rhonchi or rales.  Chest:     Chest wall: No tenderness.  Abdominal:     Palpations: Abdomen is soft.     Tenderness: There is no abdominal tenderness.  Musculoskeletal:        General: No swelling.     Cervical back: Neck supple.     Right lower leg: No tenderness. No edema.     Left lower leg: No tenderness. No edema.  Skin:    General: Skin is warm and dry.     Capillary Refill: Capillary refill takes less than 2 seconds.     Findings: No erythema.  Neurological:     General: No focal deficit present.     Mental Status: She is alert.  Psychiatric:        Mood and Affect: Mood normal.     ED Results / Procedures / Treatments   Labs (all labs ordered are listed, but only abnormal results are displayed) Labs Reviewed  BASIC METABOLIC PANEL - Abnormal; Notable for the following components:      Result Value   Sodium 128 (*)    Chloride 93 (*)    Glucose, Bld 110 (*)    Creatinine, Ser 1.02 (*)    GFR, Estimated 54 (*)    All other components within normal limits  CBC - Abnormal; Notable for the following components:   WBC 15.1 (*)    All other components within normal limits  TROPONIN I (HIGH SENSITIVITY)  TROPONIN I (HIGH SENSITIVITY)    EKG EKG Interpretation  Date/Time:  Sunday March 21 2023 14:57:10 EDT Ventricular Rate:  74 PR Interval:  196 QRS Duration: 86 QT Interval:  366 QTC Calculation: 406 R Axis:   57 Text Interpretation: Normal sinus rhythm Normal ECG When compared with ECG of 13-May-2022 09:41, Criteria for Septal infarct are no longer Present when compared top rior, similar appearance No STEMI Confirmed by Theda Belfast ([id number]) on 03/21/2023 6:23:49 PM  Radiology DG Chest 2 View  Result Date: 03/21/2023 CLINICAL DATA:  Chest pain EXAM: CHEST - 2 VIEW COMPARISON:  02/23/2023 FINDINGS: The heart size and  mediastinal contours are within normal limits. No focal airspace consolidation, pleural effusion, or pneumothorax. The visualized skeletal structures are unremarkable. IMPRESSION: No active cardiopulmonary disease. Electronically Signed   By: Duanne Guess D.O.   On: 03/21/2023 15:35    Procedures Procedures    Medications Ordered in ED Medications - No data to display  ED Course/ Medical Decision Making/ A&P                             Medical Decision Making Amount and/or Complexity of Data Reviewed Labs: ordered. Radiology: ordered.   MERIEM MIELNIK is a 87 y.o. female with a past medical history significant for hypertension, diverticulosis, CKD, hypothyroidism,  anxiety, previous Whipple surgery with cholecystectomy, and previous pancreatic cyst who presents from urgent care for further evaluation of abdominal and chest discomfort.  According to patient, for the last 2 days she has had some nausea and discomfort in her epigastric area going towards her chest.  She reports it is very mild and feels like her reflux is flaring up.  She takes her pantoprazole and has improved but went to urgent care today and they told her to come to the emergency department for cardiac evaluation.  She says that she was going to see her cardiologist in several days but they insisted she come get evaluated.  She is otherwise denying chest pain now.  She denies shortness of breath, diaphoresis, syncope, or lightheadedness.  She denies any leg pain or leg swelling.  She denies any trauma.  She denies any constipation, diarrhea, or urinary changes.  She reports she had some left lateral chest discomfort 2 weeks ago when she leaned over a center console on a car and heard a rib although she reportedly had negative imaging at that time.  She reports no pain there now.  She otherwise has been here for several hours and has minimal complaints.  On exam, lungs clear.  Chest nontender.  Abdomen nontender.  No  epigastric tenderness or upper abdominal tenderness.  No rash to suggest shingles.  Normal bowel sounds.  Lungs clear.  No murmur on my exam.  Good pulses in extremities.  Patient resting comfortably without complaints.  Vital signs showed elevated blood pressure in the 140s but otherwise reassuring vitals.  No hypoxia or tachycardia.  EKG shows no STEMI and shows no significant abnormalities.  No arrhythmia.  Had a shared decision-making conversation about workup that has already been done in triage.  Patient had metabolic panel that showed some evidence of dehydration with low sodium and chloride however kidney function was reassuring at 1.03.  She had chest x-ray that showed no acute abnormality and EKG was reassuring.  Initial troponin is negative at 5.  We discussed getting second troponin however patient has been here for several hours prior to the first troponin.  We offered hepatic function lipase, offered a second troponin, offered a GI cocktail, and even offered CT imaging to further evaluate this discomfort however patient thinks is just reflux and would like to go home.  She does not want further workup.  She says she is scheduled to see her cardiologist in several days and understands return precautions.  She did want a prescription for some nausea medicine so we will give prescription for some Zofran.  She has reflux medicine at home and will take that.  She did not want a GI cocktail.  Patient did not want further workup and given her well appearance and stability for over 5-1/2 hours, we agree with plan for discharge home with good return precautions.  She had no other questions or concerns and was discharged in good condition.            Final Clinical Impression(s) / ED Diagnoses Final diagnoses:  Epigastric discomfort  Chest discomfort  Nausea    Rx / DC Orders ED Discharge Orders          Ordered    ondansetron (ZOFRAN-ODT) 4 MG disintegrating tablet  Every 8 hours PRN         03/21/23 2001            Clinical Impression: 1. Epigastric discomfort   2. Chest discomfort  3. Nausea     Disposition: Discharge  Condition: Good  I have discussed the results, Dx and Tx plan with the pt(& family if present). He/she/they expressed understanding and agree(s) with the plan. Discharge instructions discussed at great length. Strict return precautions discussed and pt &/or family have verbalized understanding of the instructions. No further questions at time of discharge.    New Prescriptions   ONDANSETRON (ZOFRAN-ODT) 4 MG DISINTEGRATING TABLET    Take 1 tablet (4 mg total) by mouth every 8 (eight) hours as needed for nausea or vomiting.    Follow Up: your PCP and cardiologist     [facility] Emergency Department at [facility] [address] [place] [address] [phone]        Jedadiah Abdallah, Canary Brim, MD 03/21/23 2009

## 2023-03-21 NOTE — Discharge Instructions (Signed)
Your history, exam, and evaluation today were overall reassuring.  I suspect you are slightly dehydrated but improved and you are able to eat and drink here.  Your discomfort in your upper abdomen going towards her chest does seem like the reflux symptoms you have had in the past.  Your EKG did not show arrhythmia or significant cardiac abnormality and your heart enzyme was reassuring after it was checked several hours after you arrived.  Your labs showed some degree of dehydration.  We had a shared decision-making conversation offering more extensive imaging with CT scans, more extensive lab testing, or even administration of different medicines however given your stability for over 5 hours and similarity to previous reflux pain and plan to see your cardiologist in several days, we agreed that you appeared stable and safe for discharge home to follow-up as an outpatient.  We did give a prescription for nausea medicine so please use it to maintain hydration.  Please rest and stay hydrated.  Please follow-up with your primary doctor and your cardiologist.  If any symptoms change or worsen acutely, please return to the nearest emergency department.

## 2023-03-25 ENCOUNTER — Ambulatory Visit: Payer: Medicare PPO | Admitting: Cardiovascular Disease

## 2023-03-31 DIAGNOSIS — F411 Generalized anxiety disorder: Secondary | ICD-10-CM | POA: Diagnosis not present

## 2023-03-31 DIAGNOSIS — E039 Hypothyroidism, unspecified: Secondary | ICD-10-CM | POA: Diagnosis not present

## 2023-03-31 DIAGNOSIS — I10 Essential (primary) hypertension: Secondary | ICD-10-CM | POA: Diagnosis not present

## 2023-03-31 DIAGNOSIS — D649 Anemia, unspecified: Secondary | ICD-10-CM | POA: Diagnosis not present

## 2023-04-07 DIAGNOSIS — N898 Other specified noninflammatory disorders of vagina: Secondary | ICD-10-CM | POA: Diagnosis not present

## 2023-04-07 DIAGNOSIS — B3731 Acute candidiasis of vulva and vagina: Secondary | ICD-10-CM | POA: Diagnosis not present

## 2023-04-07 DIAGNOSIS — Z4689 Encounter for fitting and adjustment of other specified devices: Secondary | ICD-10-CM | POA: Diagnosis not present

## 2023-04-07 DIAGNOSIS — N8110 Cystocele, unspecified: Secondary | ICD-10-CM | POA: Diagnosis not present

## 2023-04-07 DIAGNOSIS — M1611 Unilateral primary osteoarthritis, right hip: Secondary | ICD-10-CM | POA: Diagnosis not present

## 2023-04-09 ENCOUNTER — Telehealth: Payer: Self-pay | Admitting: *Deleted

## 2023-04-09 NOTE — Telephone Encounter (Signed)
   Name: Denise Hoffman  DOB: 05/03/1936  MRN: [id number]  Primary Cardiologist: Reatha Harps, MD  Chart reviewed as part of pre-operative protocol coverage. Because of Denise Hoffman's past medical history and time since last visit, she will require a follow-up in-office visit in order to better assess preoperative cardiovascular risk.  Patient is scheduled with Dr. Flora Lipps on 04/26/2023. Appointment notes have been updated to reflect need for preoperative evaluation.   Pre-op covering staff:  - Please contact requesting surgeon's office via preferred method (i.e, phone, fax) to inform them of need for appointment prior to surgery.   Carlos Levering, NP  04/09/2023, 4:43 PM

## 2023-04-09 NOTE — Telephone Encounter (Signed)
   Pre-operative Risk Assessment    Patient Name: Denise Hoffman  DOB: 06/02/1936 MRN: [id number]      Request for Surgical Clearance    Procedure:   RIGHT TOTAL ANTERIOR HIP ARTHROPLASTY  Date of Surgery:  Clearance TBD                                 Surgeon:  DR. Gean Birchwood Surgeon's Group or Practice Name:  [facility] Phone number:  [phone] ATTN: REBECCA LANG Fax number:  [phone]   Type of Clearance Requested:   - Medical ; NO MEDICATIONS ARE LISTED AS NEEDING TO BE HELD   Type of Anesthesia:  Spinal   Additional requests/questions:    Elpidio Anis   04/09/2023, 3:01 PM

## 2023-04-09 NOTE — Telephone Encounter (Signed)
I will update all parties involved.  

## 2023-04-21 DIAGNOSIS — Z01818 Encounter for other preprocedural examination: Secondary | ICD-10-CM | POA: Diagnosis not present

## 2023-04-21 DIAGNOSIS — I10 Essential (primary) hypertension: Secondary | ICD-10-CM | POA: Diagnosis not present

## 2023-04-21 DIAGNOSIS — Z6825 Body mass index (BMI) 25.0-25.9, adult: Secondary | ICD-10-CM | POA: Diagnosis not present

## 2023-04-21 DIAGNOSIS — E039 Hypothyroidism, unspecified: Secondary | ICD-10-CM | POA: Diagnosis not present

## 2023-04-21 DIAGNOSIS — L299 Pruritus, unspecified: Secondary | ICD-10-CM | POA: Diagnosis not present

## 2023-04-21 DIAGNOSIS — M25551 Pain in right hip: Secondary | ICD-10-CM | POA: Diagnosis not present

## 2023-04-24 NOTE — Progress Notes (Unsigned)
Cardiology Office Note:   Date:  04/26/2023  NAME:  Denise Hoffman    MRN: [id number] DOB:  1936/02/19   PCP:  Jackelyn Poling, DO  Cardiologist:  Reatha Harps, MD  Electrophysiologist:  None   Referring MD: Gean Birchwood, MD   Chief Complaint  Patient presents with   Pre-op Exam         History of Present Illness:   Denise Hoffman is a 87 y.o. female with a hx of CAD, HLD, lumbar stenosis who presents for follow-up.  She reports she will have right hip surgery with Dr. Turner Daniels.  She is not that active.  Denies any chest pains or trouble breathing.  Stress test was negative.  She did have an elevated calcium score but her LDL cholesterol is 78.  She has opted against cholesterol medication.  Given her age I think this is reasonable.  She denies any chest pains or trouble breathing.  Her potassium was checked by her primary care physician and this value was 5.9.  She is on losartan.  She may need to come off of this.  We discussed rechecking this today.  Her EKG shows no changes suggestive of hyperkalemia.  This could have been an isolated and accurate value.  CV exam normal.  Denies any symptoms in office.  NM Stress 05/22/2022 Negative   Problem List 1. CAD -CAC 269 (64th percentile) -MPI negative 05/22/2022 2. HLD -T chol 143, HDL 49. LDL 78, TG 89 3. HTN    Past Medical History: Past Medical History:  Diagnosis Date   Anxiety    Arthritis    knees and fingers   Chronic kidney disease    stage 3   Coronary artery disease    Headache    Hernia    Hypertension    Hypothyroidism    Pancreatic cyst    Sciatica     Past Surgical History: Past Surgical History:  Procedure Laterality Date   CATARACT EXTRACTION  2022   COLONOSCOPY  01/30/2012   Procedure: COLONOSCOPY;  Surgeon: Louis Meckel, MD;  Location: [facility] ENDOSCOPY;  Service: Endoscopy;  Laterality: N/A;   ESOPHAGOGASTRODUODENOSCOPY  01/28/2012   Procedure: ESOPHAGOGASTRODUODENOSCOPY (EGD);  Surgeon:  Louis Meckel, MD;  Location: [facility];  Service: Endoscopy;  Laterality: N/A;   FOOT SURGERY Right    pt thinks it was the right foot, unsure. Cyst removal   HAND SURGERY Left    HERNIA REPAIR     lvh with 12 cm parietex   WHIPPLE PROCEDURE     gall bladder removed with procedure    Current Medications: Current Meds  Medication Sig   ALPRAZolam (XANAX) 0.5 MG tablet Take 0.5 mg by mouth 2 (two) times daily as needed for anxiety.   amLODipine (NORVASC) 5 MG tablet TAKE 1 TABLET BY MOUTH DAILY. PT NEEDS TO SCHEDULE AN OFFICE VISIT FOR FURTHER REFILLS.   estradiol (ESTRACE) 0.1 MG/GM vaginal cream Place 0.25 Applicatorfuls vaginally 2 (two) times a week.   levothyroxine (SYNTHROID) 50 MCG tablet Take 50 mcg by mouth daily before breakfast.   losartan (COZAAR) 100 MG tablet Take 100 mg by mouth in the morning.   metoprolol tartrate (LOPRESSOR) 100 MG tablet Take 100 mg by mouth in the morning and at bedtime.   ondansetron (ZOFRAN-ODT) 4 MG disintegrating tablet Take 1 tablet (4 mg total) by mouth every 8 (eight) hours as needed for nausea or vomiting.   Polyethyl Glycol-Propyl Glycol (LUBRICANT EYE DROPS) 0.4-0.3 %  SOLN Place 1 drop into both eyes daily.   TRIMO-SAN 0.025-0.01 % GEL Place 1 Application vaginally 2 (two) times a week.     Allergies:    Augmentin [amoxicillin-pot clavulanate], Biaxin [clarithromycin], Linzess [linaclotide], Morphine, Nsaids, Prednisone, Sulfonamide derivatives, and Vioxx [rofecoxib]   Social History: Social History   Socioeconomic History   Marital status: Married    Spouse name: Not on file   Number of children: 1   Years of education: Not on file   Highest education level: Not on file  Occupational History   Occupation: Retired    Associate Professor: RETIRED  Tobacco Use   Smoking status: Never   Smokeless tobacco: Never  Vaping Use   Vaping status: Never Used  Substance and Sexual Activity   Alcohol use: No   Drug use: No   Sexual activity:  Not on file  Other Topics Concern   Not on file  Social History Narrative   Not on file   Social Determinants of Health   Financial Resource Strain: Not on file  Food Insecurity: Not on file  Transportation Needs: Not on file  Physical Activity: Not on file  Stress: Not on file  Social Connections: Not on file     Family History: The patient's family history includes Breast cancer in her sister; Colon cancer in her brother; Stroke in her mother.  ROS:   All other ROS reviewed and negative. Pertinent positives noted in the HPI.     EKGs/Labs/Other Studies Reviewed:   The following studies were personally reviewed by me today:  EKG:  EKG is ordered today.    EKG Interpretation Date/Time:  Monday April 26 2023 08:52:36 EDT Ventricular Rate:  63 PR Interval:  254 QRS Duration:  86 QT Interval:  378 QTC Calculation: 386 R Axis:   21  Text Interpretation: Sinus rhythm with 1st degree A-V block Confirmed by Lennie Odor [phone]) on 04/26/2023 8:57:27 AM   Recent Labs: 03/21/2023: BUN 17; Creatinine, Ser 1.02; Hemoglobin 12.3; Platelets 211; Potassium 4.0; Sodium 128   Recent Lipid Panel No results found for: "CHOL", "TRIG", "HDL", "CHOLHDL", "VLDL", "LDLCALC", "LDLDIRECT"  Physical Exam:   VS:  BP 124/72   Pulse 63   Ht 5\' 3"  (1.6 m)   Wt 139 lb 9.6 oz (63.3 kg)   SpO2 95%   BMI 24.73 kg/m    Wt Readings from Last 3 Encounters:  04/26/23 139 lb 9.6 oz (63.3 kg)  03/21/23 139 lb (63 kg)  05/22/22 137 lb (62.1 kg)    General: Well nourished, well developed, in no acute distress Head: Atraumatic, normal size  Eyes: PEERLA, EOMI  Neck: Supple, no JVD Endocrine: No thryomegaly Cardiac: Normal S1, S2; RRR; no murmurs, rubs, or gallops Lungs: Clear to auscultation bilaterally, no wheezing, rhonchi or rales  Abd: Soft, nontender, no hepatomegaly  Ext: No edema, pulses 2+ Musculoskeletal: No deformities, BUE and BLE strength normal and equal Skin: Warm and dry, no  rashes   Neuro: Alert and oriented to person, place, time, and situation, CNII-XII grossly intact, no focal deficits  Psych: Normal mood and affect   ASSESSMENT:   Denise Hoffman is a 87 y.o. female who presents for the following: 1. Preop cardiovascular exam   2. Coronary artery disease involving native coronary artery of native heart without angina pectoris   3. Mixed hyperlipidemia   4. Hyperkalemia     PLAN:   1. Preop cardiovascular exam -RCRI equals 0 which equates to a very low  risk (0.4%) of major perioperative cardiovascular complication.  Recent stress test normal.  No symptoms of angina.  Echo was normal in 2021.  EKG looks normal today.  No need for further cardiac testing prior to surgery.  2. Coronary artery disease involving native coronary artery of native heart without angina pectoris 3. Mixed hyperlipidemia -Elevated coronary calcium score.  LDL on no medication is 78.  Negative nuclear medicine stress test.  Given her age and she would like to forego cholesterol-lowering medications.  I think this is reasonable.  4. Hyperkalemia -Elevated potassium check at primary care physician office.  Recheck today.  May need to come off losartan.  Disposition: Return in about 1 year (around 04/25/2024).  Medication Adjustments/Labs and Tests Ordered: Current medicines are reviewed at length with the patient today.  Concerns regarding medicines are outlined above.  Orders Placed This Encounter  Procedures   Basic metabolic panel   EKG 12-Lead   No orders of the defined types were placed in this encounter.  Patient Instructions  Medication Instructions:   No changes *If you need a refill on your cardiac medications before your next appointment, please call your pharmacy*   Lab Work: BMP If you have labs (blood work) drawn today and your tests are completely normal, you will receive your results only by: MyChart Message (if you have MyChart) OR A paper copy in  the mail If you have any lab test that is abnormal or we need to change your treatment, we will call you to review the results.   Testing/Procedures: Not needed   Follow-Up: At [facility], you and your health needs are our priority.  As part of our continuing mission to provide you with exceptional heart care, we have created designated Provider Care Teams.  These Care Teams include your primary Cardiologist (physician) and Advanced Practice Providers (APPs -  Physician Assistants and Nurse Practitioners) who all work together to provide you with the care you need, when you need it.     Your next appointment:   12 month(s)  The format for your next appointment:   In Person  Provider:   Reatha Harps, MD  or APP      Time Spent with Patient: I have spent a total of 25 minutes with patient reviewing hospital notes, telemetry, EKGs, labs and examining the patient as well as establishing an assessment and plan that was discussed with the patient.  > 50% of time was spent in direct patient care.  Signed, Lenna Gilford. Flora Lipps, MD, [facility]  [facility]  [address] [place], [place] [address] [phone]  04/26/2023 9:09 AM

## 2023-04-26 ENCOUNTER — Ambulatory Visit: Payer: Medicare PPO | Attending: Cardiovascular Disease | Admitting: Cardiovascular Disease

## 2023-04-26 ENCOUNTER — Encounter: Payer: Self-pay | Admitting: Cardiovascular Disease

## 2023-04-26 VITALS — BP 124/72 | HR 63 | Ht 63.0 in | Wt 139.6 lb

## 2023-04-26 DIAGNOSIS — Z01810 Encounter for preprocedural cardiovascular examination: Secondary | ICD-10-CM

## 2023-04-26 DIAGNOSIS — E875 Hyperkalemia: Secondary | ICD-10-CM | POA: Diagnosis not present

## 2023-04-26 DIAGNOSIS — I2510 Atherosclerotic heart disease of native coronary artery without angina pectoris: Secondary | ICD-10-CM | POA: Diagnosis not present

## 2023-04-26 DIAGNOSIS — E782 Mixed hyperlipidemia: Secondary | ICD-10-CM

## 2023-04-26 NOTE — Patient Instructions (Signed)
Medication Instructions:   No changes *If you need a refill on your cardiac medications before your next appointment, please call your pharmacy*   Lab Work: BMP If you have labs (blood work) drawn today and your tests are completely normal, you will receive your results only by: MyChart Message (if you have MyChart) OR A paper copy in the mail If you have any lab test that is abnormal or we need to change your treatment, we will call you to review the results.   Testing/Procedures: Not needed   Follow-Up: At [facility], you and your health needs are our priority.  As part of our continuing mission to provide you with exceptional heart care, we have created designated Provider Care Teams.  These Care Teams include your primary Cardiologist (physician) and Advanced Practice Providers (APPs -  Physician Assistants and Nurse Practitioners) who all work together to provide you with the care you need, when you need it.     Your next appointment:   12 month(s)  The format for your next appointment:   In Person  Provider:   Reatha Harps, MD  or APP

## 2023-04-27 LAB — BASIC METABOLIC PANEL
BUN/Creatinine Ratio: 14 (ref 12–28)
BUN: 13 mg/dL (ref 8–27)
CO2: 25 mmol/L (ref 20–29)
Calcium: 10.3 mg/dL (ref 8.7–10.3)
Chloride: 98 mmol/L (ref 96–106)
Creatinine, Ser: 0.93 mg/dL (ref 0.57–1.00)
Glucose: 85 mg/dL (ref 70–99)
Potassium: 5.3 mmol/L — ABNORMAL HIGH (ref 3.5–5.2)
Sodium: 136 mmol/L (ref 134–144)
eGFR: 61 mL/min/{1.73_m2} (ref >59)

## 2023-05-03 DIAGNOSIS — M25651 Stiffness of right hip, not elsewhere classified: Secondary | ICD-10-CM | POA: Diagnosis not present

## 2023-05-03 DIAGNOSIS — M1611 Unilateral primary osteoarthritis, right hip: Secondary | ICD-10-CM | POA: Diagnosis not present

## 2023-05-03 DIAGNOSIS — R262 Difficulty in walking, not elsewhere classified: Secondary | ICD-10-CM | POA: Diagnosis not present

## 2023-05-05 DIAGNOSIS — M1611 Unilateral primary osteoarthritis, right hip: Secondary | ICD-10-CM | POA: Diagnosis not present

## 2023-05-14 DIAGNOSIS — M1611 Unilateral primary osteoarthritis, right hip: Secondary | ICD-10-CM | POA: Diagnosis not present

## 2023-05-14 DIAGNOSIS — M25751 Osteophyte, right hip: Secondary | ICD-10-CM | POA: Diagnosis not present

## 2023-05-14 DIAGNOSIS — Z96641 Presence of right artificial hip joint: Secondary | ICD-10-CM | POA: Diagnosis not present

## 2023-05-17 DIAGNOSIS — E785 Hyperlipidemia, unspecified: Secondary | ICD-10-CM | POA: Diagnosis not present

## 2023-05-17 DIAGNOSIS — Z471 Aftercare following joint replacement surgery: Secondary | ICD-10-CM | POA: Diagnosis not present

## 2023-05-17 DIAGNOSIS — E039 Hypothyroidism, unspecified: Secondary | ICD-10-CM | POA: Diagnosis not present

## 2023-05-17 DIAGNOSIS — F419 Anxiety disorder, unspecified: Secondary | ICD-10-CM | POA: Diagnosis not present

## 2023-05-17 DIAGNOSIS — Z7982 Long term (current) use of aspirin: Secondary | ICD-10-CM | POA: Diagnosis not present

## 2023-05-17 DIAGNOSIS — I2510 Atherosclerotic heart disease of native coronary artery without angina pectoris: Secondary | ICD-10-CM | POA: Diagnosis not present

## 2023-05-17 DIAGNOSIS — N189 Chronic kidney disease, unspecified: Secondary | ICD-10-CM | POA: Diagnosis not present

## 2023-05-17 DIAGNOSIS — I129 Hypertensive chronic kidney disease with stage 1 through stage 4 chronic kidney disease, or unspecified chronic kidney disease: Secondary | ICD-10-CM | POA: Diagnosis not present

## 2023-05-17 DIAGNOSIS — Z96641 Presence of right artificial hip joint: Secondary | ICD-10-CM | POA: Diagnosis not present

## 2023-05-21 DIAGNOSIS — Z96641 Presence of right artificial hip joint: Secondary | ICD-10-CM | POA: Diagnosis not present

## 2023-05-21 DIAGNOSIS — N189 Chronic kidney disease, unspecified: Secondary | ICD-10-CM | POA: Diagnosis not present

## 2023-05-21 DIAGNOSIS — E785 Hyperlipidemia, unspecified: Secondary | ICD-10-CM | POA: Diagnosis not present

## 2023-05-21 DIAGNOSIS — I129 Hypertensive chronic kidney disease with stage 1 through stage 4 chronic kidney disease, or unspecified chronic kidney disease: Secondary | ICD-10-CM | POA: Diagnosis not present

## 2023-05-21 DIAGNOSIS — E039 Hypothyroidism, unspecified: Secondary | ICD-10-CM | POA: Diagnosis not present

## 2023-05-21 DIAGNOSIS — I2510 Atherosclerotic heart disease of native coronary artery without angina pectoris: Secondary | ICD-10-CM | POA: Diagnosis not present

## 2023-05-21 DIAGNOSIS — Z471 Aftercare following joint replacement surgery: Secondary | ICD-10-CM | POA: Diagnosis not present

## 2023-05-21 DIAGNOSIS — F419 Anxiety disorder, unspecified: Secondary | ICD-10-CM | POA: Diagnosis not present

## 2023-05-21 DIAGNOSIS — Z7982 Long term (current) use of aspirin: Secondary | ICD-10-CM | POA: Diagnosis not present

## 2023-05-24 DIAGNOSIS — E785 Hyperlipidemia, unspecified: Secondary | ICD-10-CM | POA: Diagnosis not present

## 2023-05-24 DIAGNOSIS — Z471 Aftercare following joint replacement surgery: Secondary | ICD-10-CM | POA: Diagnosis not present

## 2023-05-24 DIAGNOSIS — F419 Anxiety disorder, unspecified: Secondary | ICD-10-CM | POA: Diagnosis not present

## 2023-05-24 DIAGNOSIS — Z7982 Long term (current) use of aspirin: Secondary | ICD-10-CM | POA: Diagnosis not present

## 2023-05-24 DIAGNOSIS — I129 Hypertensive chronic kidney disease with stage 1 through stage 4 chronic kidney disease, or unspecified chronic kidney disease: Secondary | ICD-10-CM | POA: Diagnosis not present

## 2023-05-24 DIAGNOSIS — I2510 Atherosclerotic heart disease of native coronary artery without angina pectoris: Secondary | ICD-10-CM | POA: Diagnosis not present

## 2023-05-24 DIAGNOSIS — N189 Chronic kidney disease, unspecified: Secondary | ICD-10-CM | POA: Diagnosis not present

## 2023-05-24 DIAGNOSIS — E039 Hypothyroidism, unspecified: Secondary | ICD-10-CM | POA: Diagnosis not present

## 2023-05-24 DIAGNOSIS — Z96641 Presence of right artificial hip joint: Secondary | ICD-10-CM | POA: Diagnosis not present

## 2023-05-25 DIAGNOSIS — M1611 Unilateral primary osteoarthritis, right hip: Secondary | ICD-10-CM | POA: Diagnosis not present

## 2023-05-26 DIAGNOSIS — Z471 Aftercare following joint replacement surgery: Secondary | ICD-10-CM | POA: Diagnosis not present

## 2023-05-26 DIAGNOSIS — F419 Anxiety disorder, unspecified: Secondary | ICD-10-CM | POA: Diagnosis not present

## 2023-05-26 DIAGNOSIS — I2510 Atherosclerotic heart disease of native coronary artery without angina pectoris: Secondary | ICD-10-CM | POA: Diagnosis not present

## 2023-05-26 DIAGNOSIS — E039 Hypothyroidism, unspecified: Secondary | ICD-10-CM | POA: Diagnosis not present

## 2023-05-26 DIAGNOSIS — Z96641 Presence of right artificial hip joint: Secondary | ICD-10-CM | POA: Diagnosis not present

## 2023-05-26 DIAGNOSIS — I129 Hypertensive chronic kidney disease with stage 1 through stage 4 chronic kidney disease, or unspecified chronic kidney disease: Secondary | ICD-10-CM | POA: Diagnosis not present

## 2023-05-26 DIAGNOSIS — N189 Chronic kidney disease, unspecified: Secondary | ICD-10-CM | POA: Diagnosis not present

## 2023-05-26 DIAGNOSIS — E785 Hyperlipidemia, unspecified: Secondary | ICD-10-CM | POA: Diagnosis not present

## 2023-05-26 DIAGNOSIS — Z7982 Long term (current) use of aspirin: Secondary | ICD-10-CM | POA: Diagnosis not present

## 2023-06-02 DIAGNOSIS — F419 Anxiety disorder, unspecified: Secondary | ICD-10-CM | POA: Diagnosis not present

## 2023-06-02 DIAGNOSIS — E039 Hypothyroidism, unspecified: Secondary | ICD-10-CM | POA: Diagnosis not present

## 2023-06-02 DIAGNOSIS — I129 Hypertensive chronic kidney disease with stage 1 through stage 4 chronic kidney disease, or unspecified chronic kidney disease: Secondary | ICD-10-CM | POA: Diagnosis not present

## 2023-06-02 DIAGNOSIS — Z96641 Presence of right artificial hip joint: Secondary | ICD-10-CM | POA: Diagnosis not present

## 2023-06-02 DIAGNOSIS — I2510 Atherosclerotic heart disease of native coronary artery without angina pectoris: Secondary | ICD-10-CM | POA: Diagnosis not present

## 2023-06-02 DIAGNOSIS — E785 Hyperlipidemia, unspecified: Secondary | ICD-10-CM | POA: Diagnosis not present

## 2023-06-02 DIAGNOSIS — N189 Chronic kidney disease, unspecified: Secondary | ICD-10-CM | POA: Diagnosis not present

## 2023-06-02 DIAGNOSIS — Z471 Aftercare following joint replacement surgery: Secondary | ICD-10-CM | POA: Diagnosis not present

## 2023-06-02 DIAGNOSIS — Z7982 Long term (current) use of aspirin: Secondary | ICD-10-CM | POA: Diagnosis not present

## 2023-06-10 DIAGNOSIS — Z471 Aftercare following joint replacement surgery: Secondary | ICD-10-CM | POA: Diagnosis not present

## 2023-06-10 DIAGNOSIS — I2510 Atherosclerotic heart disease of native coronary artery without angina pectoris: Secondary | ICD-10-CM | POA: Diagnosis not present

## 2023-06-10 DIAGNOSIS — Z7982 Long term (current) use of aspirin: Secondary | ICD-10-CM | POA: Diagnosis not present

## 2023-06-10 DIAGNOSIS — I129 Hypertensive chronic kidney disease with stage 1 through stage 4 chronic kidney disease, or unspecified chronic kidney disease: Secondary | ICD-10-CM | POA: Diagnosis not present

## 2023-06-10 DIAGNOSIS — F419 Anxiety disorder, unspecified: Secondary | ICD-10-CM | POA: Diagnosis not present

## 2023-06-10 DIAGNOSIS — E039 Hypothyroidism, unspecified: Secondary | ICD-10-CM | POA: Diagnosis not present

## 2023-06-10 DIAGNOSIS — E785 Hyperlipidemia, unspecified: Secondary | ICD-10-CM | POA: Diagnosis not present

## 2023-06-10 DIAGNOSIS — N189 Chronic kidney disease, unspecified: Secondary | ICD-10-CM | POA: Diagnosis not present

## 2023-06-10 DIAGNOSIS — Z96641 Presence of right artificial hip joint: Secondary | ICD-10-CM | POA: Diagnosis not present

## 2023-06-15 DIAGNOSIS — N189 Chronic kidney disease, unspecified: Secondary | ICD-10-CM | POA: Diagnosis not present

## 2023-06-15 DIAGNOSIS — E039 Hypothyroidism, unspecified: Secondary | ICD-10-CM | POA: Diagnosis not present

## 2023-06-15 DIAGNOSIS — I2510 Atherosclerotic heart disease of native coronary artery without angina pectoris: Secondary | ICD-10-CM | POA: Diagnosis not present

## 2023-06-15 DIAGNOSIS — I129 Hypertensive chronic kidney disease with stage 1 through stage 4 chronic kidney disease, or unspecified chronic kidney disease: Secondary | ICD-10-CM | POA: Diagnosis not present

## 2023-06-15 DIAGNOSIS — Z471 Aftercare following joint replacement surgery: Secondary | ICD-10-CM | POA: Diagnosis not present

## 2023-06-15 DIAGNOSIS — Z7982 Long term (current) use of aspirin: Secondary | ICD-10-CM | POA: Diagnosis not present

## 2023-06-15 DIAGNOSIS — E785 Hyperlipidemia, unspecified: Secondary | ICD-10-CM | POA: Diagnosis not present

## 2023-06-15 DIAGNOSIS — Z96641 Presence of right artificial hip joint: Secondary | ICD-10-CM | POA: Diagnosis not present

## 2023-06-15 DIAGNOSIS — F419 Anxiety disorder, unspecified: Secondary | ICD-10-CM | POA: Diagnosis not present

## 2023-07-19 DIAGNOSIS — N8110 Cystocele, unspecified: Secondary | ICD-10-CM | POA: Diagnosis not present

## 2023-07-19 DIAGNOSIS — Z4689 Encounter for fitting and adjustment of other specified devices: Secondary | ICD-10-CM | POA: Diagnosis not present

## 2023-08-09 IMAGING — MR MR LUMBAR SPINE W/O CM
4 of 5 series · 27 of 48 positions shown · non-contrast
Comparison: Lumbar spine MRI 09/16/2018

CLINICAL DATA: .

Low back pain, unspecified back pain laterality, unspecified
chronicity, unspecified whether sciatica present.
EXAM:
MRI LUMBAR SPINE WITHOUT CONTRAST
TECHNIQUE: Multiplanar, multisequence MR imaging of the lumbar spine was
performed. No intravenous contrast was administered.

[Series 2: T2 · sagittal · 4.0mm · 1.09mm/px · 6 of 17 slices shown (1 of 2)]
[im 1/17]
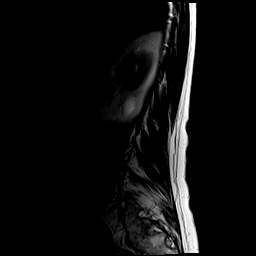
[im 4/17]
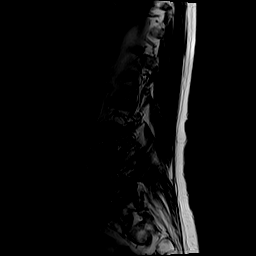
[im 7/17]
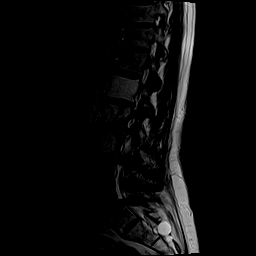
[im 10/17]
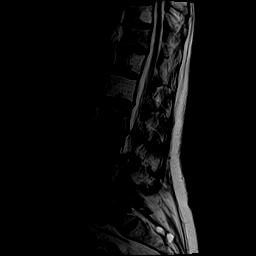
[im 13/17]
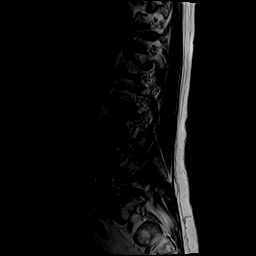
[im 17/17]
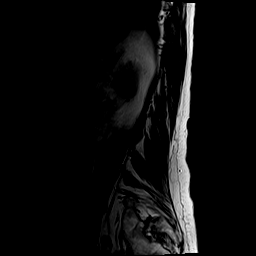

[Series 4: T1 · sagittal · 4.0mm · 1.09mm/px · 6 of 17 slices shown (1 of 2)]
[im 1/17]
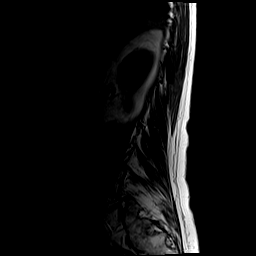
[im 4/17]
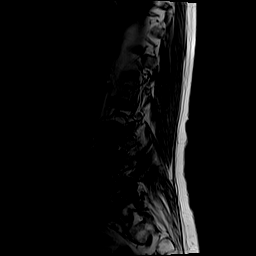
[im 7/17]
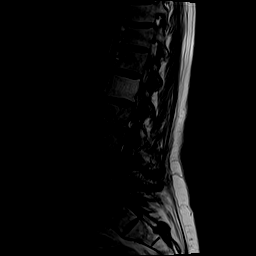
[im 10/17]
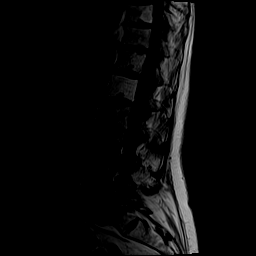
[im 13/17]
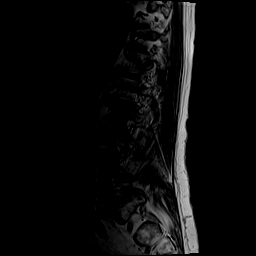
[im 17/17]
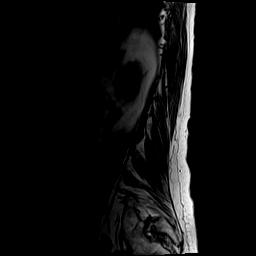

[Series 5: T2 · axial · 4.0mm · 0.39mm/px · z∈[-66,+158]mm · 9 of 42 slices shown (2 of 2)]
[im 1/42]
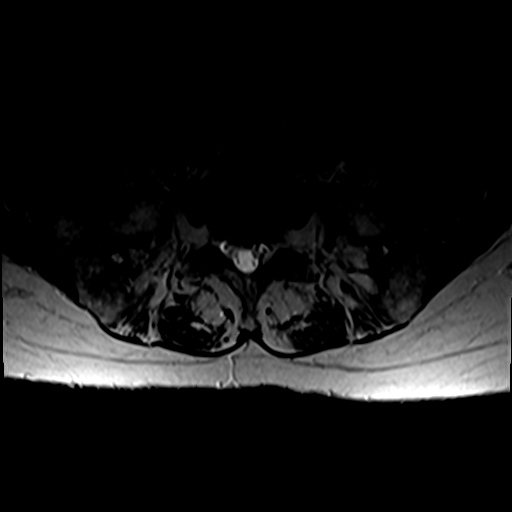
[im 6/42]
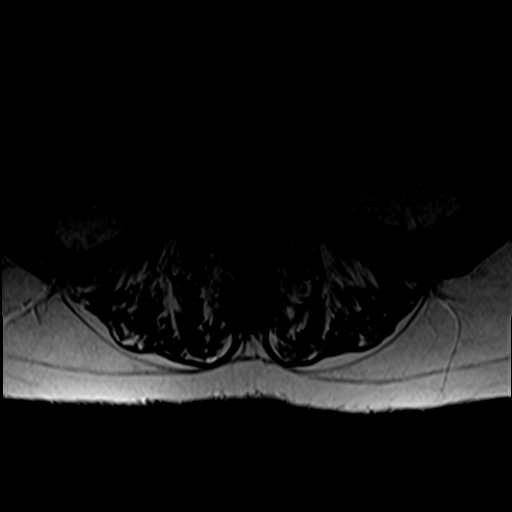
[im 12/42]
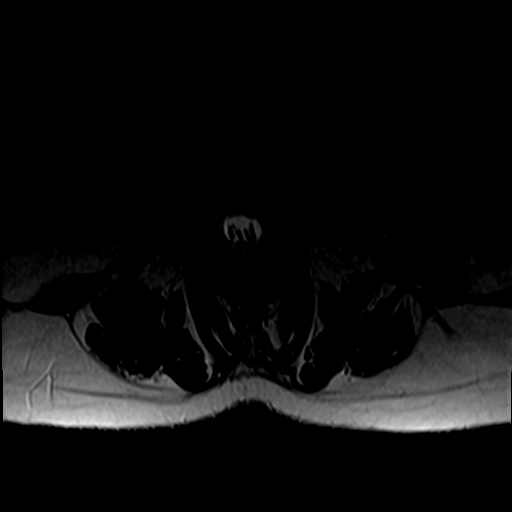
[im 18/42]
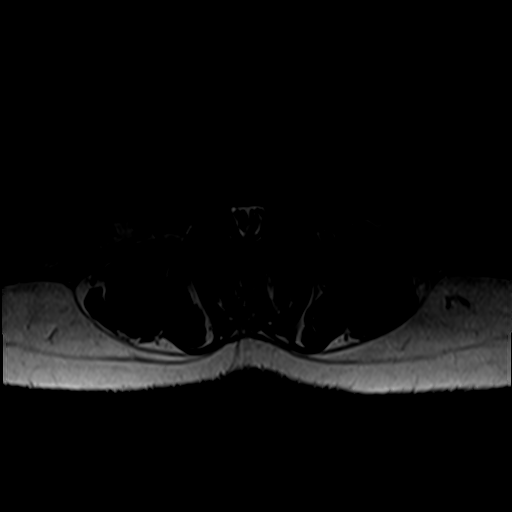
[im 21/42]
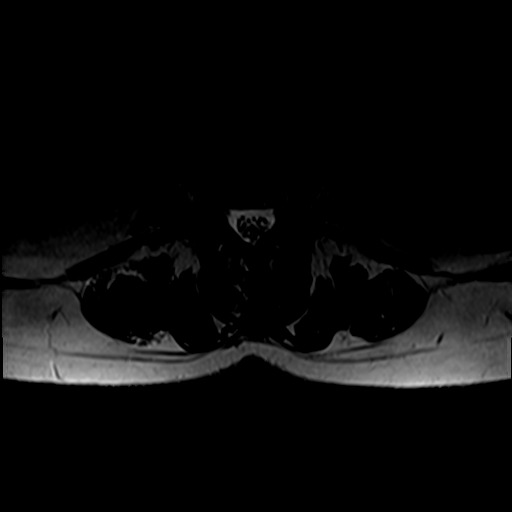
[im 24/42]
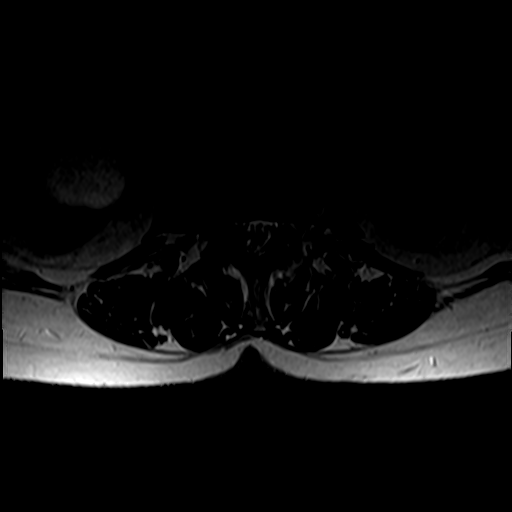
[im 30/42]
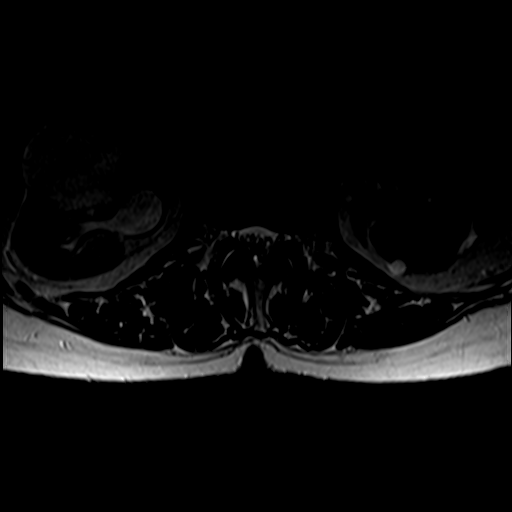
[im 36/42]
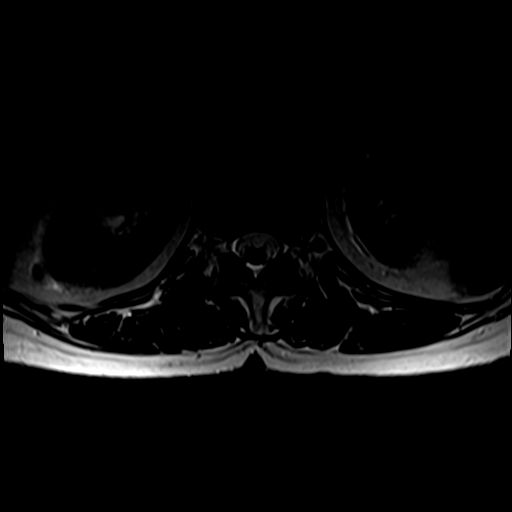
[im 42/42]
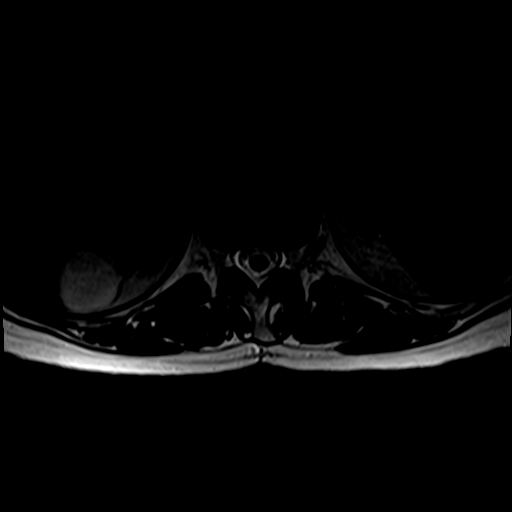

[Series 6: T1 · axial · 4.0mm · 0.39mm/px · z∈[-66,+130]mm · 6 of 42 slices shown (2 of 2)]
[im 1/42]
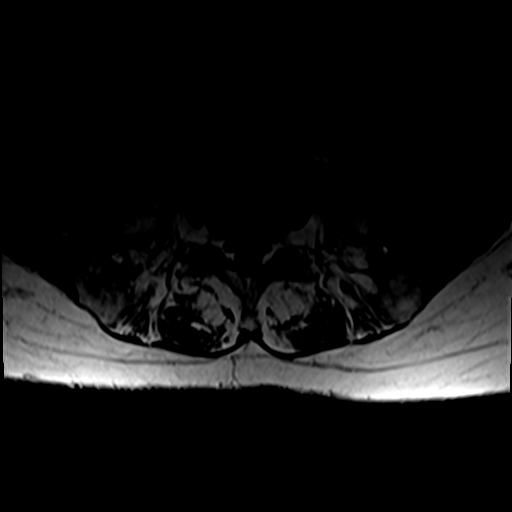
[im 6/42]
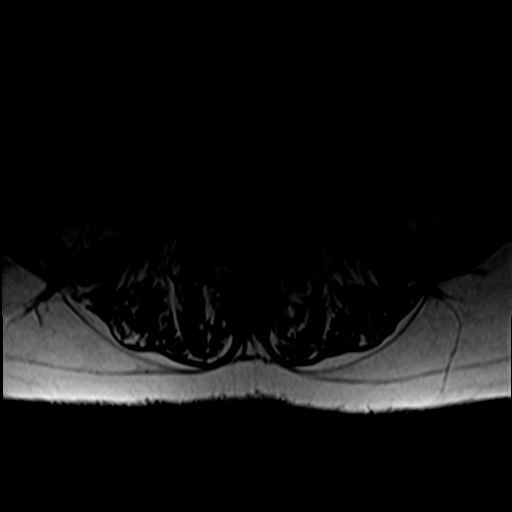
[im 12/42]
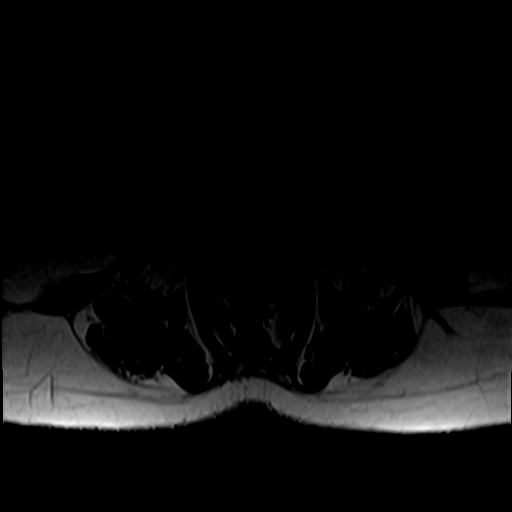
[im 18/42]
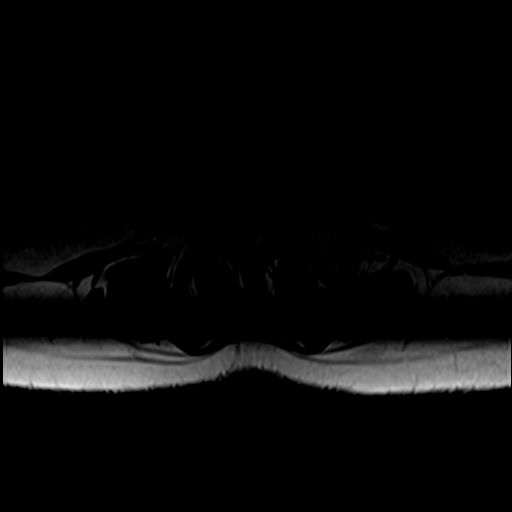
[im 21/42]
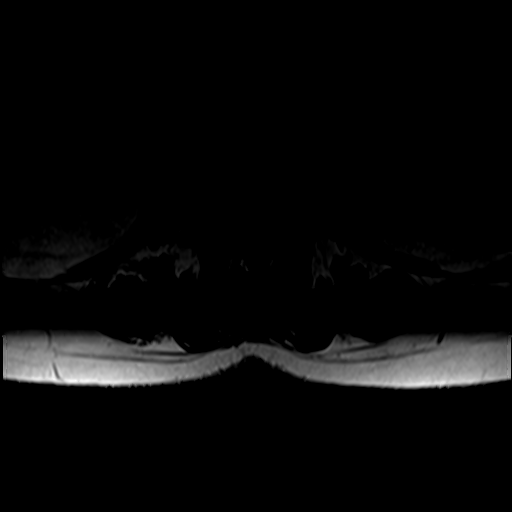
[im 36/42]
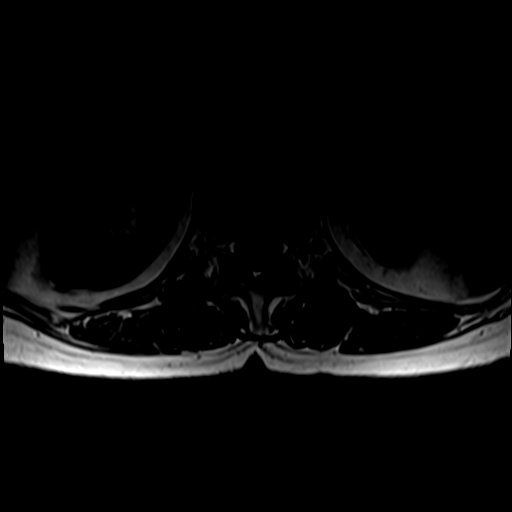

[27 of 48 positions shown; findings below may reference images not displayed]

FINDINGS: Segmentation:  Standard.

Alignment: Grade 1 retrolisthesis at L3-4 and L4-5. Grade 1
anterolisthesis at L5-S1.

Vertebrae: Unchanged appearance of multiple hemangiomas. No acute
abnormality.

Conus medullaris and cauda equina: Conus extends to the L2 level.
Conus and cauda equina appear normal.

Paraspinal and other soft tissues: Unchanged appearance of renal
cysts.

Disc levels:

L1-L2: Unchanged small disc bulge. No spinal canal stenosis. No
neural foraminal stenosis.

L2-L3: Unchanged right asymmetric disc bulge. No spinal canal
stenosis. No neural foraminal stenosis.

L3-L4: Slightly worsened right asymmetric disc bulge. Slight
worsening of mild spinal canal stenosis. Unchanged mild bilateral
neural foraminal stenosis.

L4-L5: Unchanged small disc bulge. No spinal canal stenosis.
Unchanged mild bilateral neural foraminal stenosis.

L5-S1: Severe facet hypertrophy with disc uncovering. Unchanged
severe spinal canal stenosis. Unchanged severe bilateral neural
foraminal stenosis.

Visualized sacrum: Normal.
IMPRESSION: 1. Unchanged severe spinal canal and bilateral neural foraminal
stenosis at L5-S1.
2. Slight worsening of mild L3-4 spinal canal stenosis.
3. Unchanged mild bilateral L4-5 neural foraminal stenosis.

## 2023-09-20 DIAGNOSIS — Z6825 Body mass index (BMI) 25.0-25.9, adult: Secondary | ICD-10-CM | POA: Diagnosis not present

## 2023-09-20 DIAGNOSIS — J0141 Acute recurrent pansinusitis: Secondary | ICD-10-CM | POA: Diagnosis not present

## 2023-10-14 DIAGNOSIS — Z0000 Encounter for general adult medical examination without abnormal findings: Secondary | ICD-10-CM | POA: Diagnosis not present

## 2023-10-14 DIAGNOSIS — I10 Essential (primary) hypertension: Secondary | ICD-10-CM | POA: Diagnosis not present

## 2023-10-14 DIAGNOSIS — E039 Hypothyroidism, unspecified: Secondary | ICD-10-CM | POA: Diagnosis not present

## 2023-10-14 DIAGNOSIS — D500 Iron deficiency anemia secondary to blood loss (chronic): Secondary | ICD-10-CM | POA: Diagnosis not present

## 2023-10-14 DIAGNOSIS — F419 Anxiety disorder, unspecified: Secondary | ICD-10-CM | POA: Diagnosis not present

## 2023-10-14 DIAGNOSIS — N1831 Chronic kidney disease, stage 3a: Secondary | ICD-10-CM | POA: Diagnosis not present

## 2023-10-14 DIAGNOSIS — K219 Gastro-esophageal reflux disease without esophagitis: Secondary | ICD-10-CM | POA: Diagnosis not present

## 2023-10-14 DIAGNOSIS — G629 Polyneuropathy, unspecified: Secondary | ICD-10-CM | POA: Diagnosis not present

## 2023-10-14 DIAGNOSIS — J069 Acute upper respiratory infection, unspecified: Secondary | ICD-10-CM | POA: Diagnosis not present

## 2023-10-15 DIAGNOSIS — E875 Hyperkalemia: Secondary | ICD-10-CM | POA: Diagnosis not present

## 2023-10-15 DIAGNOSIS — I10 Essential (primary) hypertension: Secondary | ICD-10-CM | POA: Diagnosis not present

## 2023-10-21 DIAGNOSIS — Z4689 Encounter for fitting and adjustment of other specified devices: Secondary | ICD-10-CM | POA: Diagnosis not present

## 2023-10-21 DIAGNOSIS — N8110 Cystocele, unspecified: Secondary | ICD-10-CM | POA: Diagnosis not present

## 2023-10-21 DIAGNOSIS — R32 Unspecified urinary incontinence: Secondary | ICD-10-CM | POA: Diagnosis not present

## 2023-11-16 DIAGNOSIS — I10 Essential (primary) hypertension: Secondary | ICD-10-CM | POA: Diagnosis not present

## 2023-12-20 DIAGNOSIS — J988 Other specified respiratory disorders: Secondary | ICD-10-CM | POA: Diagnosis not present

## 2023-12-20 DIAGNOSIS — R051 Acute cough: Secondary | ICD-10-CM | POA: Diagnosis not present

## 2023-12-20 DIAGNOSIS — B9789 Other viral agents as the cause of diseases classified elsewhere: Secondary | ICD-10-CM | POA: Diagnosis not present

## 2023-12-20 DIAGNOSIS — G479 Sleep disorder, unspecified: Secondary | ICD-10-CM | POA: Diagnosis not present

## 2023-12-29 DIAGNOSIS — H04123 Dry eye syndrome of bilateral lacrimal glands: Secondary | ICD-10-CM | POA: Diagnosis not present

## 2023-12-29 DIAGNOSIS — Z961 Presence of intraocular lens: Secondary | ICD-10-CM | POA: Diagnosis not present

## 2024-01-07 ENCOUNTER — Other Ambulatory Visit: Payer: Self-pay | Admitting: Cardiovascular Disease

## 2024-01-20 DIAGNOSIS — Z1231 Encounter for screening mammogram for malignant neoplasm of breast: Secondary | ICD-10-CM | POA: Diagnosis not present

## 2024-01-20 DIAGNOSIS — N8110 Cystocele, unspecified: Secondary | ICD-10-CM | POA: Diagnosis not present

## 2024-01-20 DIAGNOSIS — N8111 Cystocele, midline: Secondary | ICD-10-CM | POA: Diagnosis not present

## 2024-01-20 DIAGNOSIS — Z4689 Encounter for fitting and adjustment of other specified devices: Secondary | ICD-10-CM | POA: Diagnosis not present

## 2024-01-20 DIAGNOSIS — L853 Xerosis cutis: Secondary | ICD-10-CM | POA: Diagnosis not present

## 2024-02-10 DIAGNOSIS — M25512 Pain in left shoulder: Secondary | ICD-10-CM | POA: Diagnosis not present

## 2024-02-10 DIAGNOSIS — F4323 Adjustment disorder with mixed anxiety and depressed mood: Secondary | ICD-10-CM | POA: Diagnosis not present

## 2024-02-10 DIAGNOSIS — Z6825 Body mass index (BMI) 25.0-25.9, adult: Secondary | ICD-10-CM | POA: Diagnosis not present

## 2024-02-10 DIAGNOSIS — R5383 Other fatigue: Secondary | ICD-10-CM | POA: Diagnosis not present

## 2024-02-16 DIAGNOSIS — E875 Hyperkalemia: Secondary | ICD-10-CM | POA: Diagnosis not present

## 2024-02-17 DIAGNOSIS — M25551 Pain in right hip: Secondary | ICD-10-CM | POA: Diagnosis not present

## 2024-02-17 DIAGNOSIS — M48062 Spinal stenosis, lumbar region with neurogenic claudication: Secondary | ICD-10-CM | POA: Diagnosis not present

## 2024-02-17 DIAGNOSIS — M4727 Other spondylosis with radiculopathy, lumbosacral region: Secondary | ICD-10-CM | POA: Diagnosis not present

## 2024-02-17 DIAGNOSIS — G894 Chronic pain syndrome: Secondary | ICD-10-CM | POA: Diagnosis not present

## 2024-02-24 ENCOUNTER — Other Ambulatory Visit: Payer: Self-pay | Admitting: Cardiovascular Disease

## 2024-02-29 DIAGNOSIS — M1711 Unilateral primary osteoarthritis, right knee: Secondary | ICD-10-CM | POA: Diagnosis not present

## 2024-04-19 DIAGNOSIS — N8111 Cystocele, midline: Secondary | ICD-10-CM | POA: Diagnosis not present

## 2024-04-19 DIAGNOSIS — Z4689 Encounter for fitting and adjustment of other specified devices: Secondary | ICD-10-CM | POA: Diagnosis not present

## 2024-05-22 DIAGNOSIS — M48062 Spinal stenosis, lumbar region with neurogenic claudication: Secondary | ICD-10-CM | POA: Diagnosis not present

## 2024-05-30 DIAGNOSIS — I10 Essential (primary) hypertension: Secondary | ICD-10-CM | POA: Diagnosis not present

## 2024-05-30 DIAGNOSIS — E039 Hypothyroidism, unspecified: Secondary | ICD-10-CM | POA: Diagnosis not present

## 2024-05-30 DIAGNOSIS — K219 Gastro-esophageal reflux disease without esophagitis: Secondary | ICD-10-CM | POA: Diagnosis not present

## 2024-05-30 DIAGNOSIS — F419 Anxiety disorder, unspecified: Secondary | ICD-10-CM | POA: Diagnosis not present

## 2024-06-20 DIAGNOSIS — C44311 Basal cell carcinoma of skin of nose: Secondary | ICD-10-CM | POA: Diagnosis not present

## 2024-06-20 DIAGNOSIS — D485 Neoplasm of uncertain behavior of skin: Secondary | ICD-10-CM | POA: Diagnosis not present

## 2024-06-20 DIAGNOSIS — L853 Xerosis cutis: Secondary | ICD-10-CM | POA: Diagnosis not present

## 2024-06-20 DIAGNOSIS — L720 Epidermal cyst: Secondary | ICD-10-CM | POA: Diagnosis not present

## 2024-07-18 DIAGNOSIS — C44311 Basal cell carcinoma of skin of nose: Secondary | ICD-10-CM | POA: Diagnosis not present

## 2024-07-24 ENCOUNTER — Ambulatory Visit (INDEPENDENT_AMBULATORY_CARE_PROVIDER_SITE_OTHER)

## 2024-07-24 ENCOUNTER — Ambulatory Visit (INDEPENDENT_AMBULATORY_CARE_PROVIDER_SITE_OTHER): Admitting: Podiatry

## 2024-07-24 DIAGNOSIS — M216X9 Other acquired deformities of unspecified foot: Secondary | ICD-10-CM

## 2024-07-24 DIAGNOSIS — L909 Atrophic disorder of skin, unspecified: Secondary | ICD-10-CM

## 2024-07-24 DIAGNOSIS — M79674 Pain in right toe(s): Secondary | ICD-10-CM

## 2024-07-24 DIAGNOSIS — L84 Corns and callosities: Secondary | ICD-10-CM | POA: Diagnosis not present

## 2024-07-24 DIAGNOSIS — M205X1 Other deformities of toe(s) (acquired), right foot: Secondary | ICD-10-CM | POA: Diagnosis not present

## 2024-07-24 NOTE — Progress Notes (Signed)
 Chief Complaint  Patient presents with   Foot Pain    Pt. Complains of cold, burning, numbness, throbbing and tingling in the feet. Taking Gabapentin and had a reaction. Made patient sick. Gel spacers helped for a moment and started hurting again. Non diabetic. Ankles start to swell by end of day if on for a period of time.   HPI: 88 y.o. female presents today with a few concerns.  She notes generalized foot pain in both feet that are present all the time.  Denies any nerve injury at the time of symptom onset.  She states her feet feel cold, numb, throbbing, sharp pain, and tingling all the time.  Notes that she is not on her feet as much as she used to be because of the chronic discomfort.  She tried low-dose gabapentin in the past and had nausea side effects from it.  She also has pain on the right fifth toe.  States it is very tender.  She currently wears slip in casual shoes with minimal cushioning and no support.  Past Medical History:  Diagnosis Date   Anxiety    Arthritis    knees and fingers   Chronic kidney disease    stage 3   Coronary artery disease    Headache    Hernia    Hypertension    Hypothyroidism    Pancreatic cyst    Sciatica    Past Surgical History:  Procedure Laterality Date   CATARACT EXTRACTION  2022   COLONOSCOPY  01/30/2012   Procedure: COLONOSCOPY;  Surgeon: Lamar JONETTA Aho, MD;  Location: [facility];  Service: Endoscopy;  Laterality: N/A;   ESOPHAGOGASTRODUODENOSCOPY  01/28/2012   Procedure: ESOPHAGOGASTRODUODENOSCOPY (EGD);  Surgeon: Lamar JONETTA Aho, MD;  Location: [facility];  Service: Endoscopy;  Laterality: N/A;   FOOT SURGERY Right    pt thinks it was the right foot, unsure. Cyst removal   HAND SURGERY Left    HERNIA REPAIR     lvh with 12 cm parietex   WHIPPLE PROCEDURE     gall bladder removed with procedure   Allergies  Allergen Reactions   Augmentin [Amoxicillin-Pot Clavulanate] Nausea Only   Biaxin [Clarithromycin] Nausea  Only   Linzess [Linaclotide] Other (See Comments)    Made stomach feel weird (bloating/diarrhea)   Morphine Hives   Nsaids Other (See Comments)     Chronic Kidney Disease   Prednisone Nausea Only    With high doses=nauseous  Can tolerate lower doses   Sulfonamide Derivatives Other (See Comments)    Unknown reaction   Vioxx [Rofecoxib] Other (See Comments)    Unknown reaction.    Physical Exam: 1/4 pedal pulses bilateral.  No appreciable edema.  Right fifth toe has adduction and varus rotation present with an interdigital corn on the medial fifth toe DIPJ.  There is pain on palpation of the lesion.  No erythema or edema is appreciated.  She has a cavus foot type which is semiflexible.  She has loss of fat pad to the forefoot bilateral.  There are semiflexible contractures of the toes at the PIP joints.  Epicritic sensation is intact.  Negative Tinel's sign with percussion of the posterior tibial nerve bilateral.  Radiographic Exam (right foot, 3 weightbearing views, 07/24/2024):  Normal osseous mineralization. No fractures noted.  Medial angulation of the fifth toe at the level of the DIPJ.  Assessment/Plan of Care: 1. Adductovarus rotation of toe, acquired, right   2. Pain in toe of right  foot   3. Cavus deformity of foot, acquired   4. Fat pad atrophy of foot   5. Corns      FOR HOME USE ONLY DME CUSTOM ORTHOTICS  The patient's foot type, the cavus foot does not usually do well with over-the-counter arch supports.  Her shoes are not providing much relief.  She will need a custom orthotic made for a cavus foot type with additional padding in the heel and ball of foot to make up for the fat pad atrophy.  She is willing to try this.  Will get her scheduled for an orthotic consult with our pedorthist.  The corn on the medial aspect of the right fifth toe was shaved with sterile 313 blade uneventfully.  She was given a mini double blade a gel toe loop to try to keep pressure off of the  fifth toe.  Since she had nausea from the low-dose gabapentin, we will try a compounded medication for neuropathy symptoms to see if this provides more improvement without the unwanted side effects.  This was sent to [employer] today.  Informed patient they may be reaching out to her to discuss the coverage and confirm her mailing address to deliver to her via mail.  After the appointment was completed, the medical assistant went back in the room and states that the patient also requested toenail trimming.  She will be rescheduled for this at her convenience.  Awanda CHARM Imperial, DPM, FACFAS [facility]     [address] [place], [place] [address]                Office [phone]  Fax [phone]

## 2024-07-27 ENCOUNTER — Telehealth: Payer: Self-pay | Admitting: Podiatry

## 2024-07-27 NOTE — Telephone Encounter (Signed)
 Diclofenac sodium needs a prior authorization call [phone] [facility] clinic pharmacy review dept. Thank you

## 2024-07-31 ENCOUNTER — Telehealth: Payer: Self-pay | Admitting: Lab

## 2024-07-31 NOTE — Telephone Encounter (Signed)
 Patient states is confused on why she has two compound medications ordered would like provider to call and touch base.

## 2024-08-02 ENCOUNTER — Other Ambulatory Visit

## 2024-08-02 DIAGNOSIS — Z4689 Encounter for fitting and adjustment of other specified devices: Secondary | ICD-10-CM | POA: Diagnosis not present

## 2024-08-03 ENCOUNTER — Ambulatory Visit (INDEPENDENT_AMBULATORY_CARE_PROVIDER_SITE_OTHER): Admitting: Podiatry

## 2024-08-03 ENCOUNTER — Telehealth: Payer: Self-pay | Admitting: Podiatry

## 2024-08-03 DIAGNOSIS — M205X2 Other deformities of toe(s) (acquired), left foot: Secondary | ICD-10-CM

## 2024-08-03 DIAGNOSIS — L909 Atrophic disorder of skin, unspecified: Secondary | ICD-10-CM

## 2024-08-03 DIAGNOSIS — M216X9 Other acquired deformities of unspecified foot: Secondary | ICD-10-CM

## 2024-08-03 DIAGNOSIS — M205X1 Other deformities of toe(s) (acquired), right foot: Secondary | ICD-10-CM | POA: Diagnosis not present

## 2024-08-03 NOTE — Telephone Encounter (Signed)
 Patient states that [facility] advised her to request an authorization in written form. Because it's possible they will cover it. [phone] RX [id number]

## 2024-08-03 NOTE — Progress Notes (Signed)
 Patient presents today to be casted for custom molded orthotics. Dr. Dia McCaughan has been treating patient for Adductovarus rotation of toe, Cavus Deformity, and Fat Pad Atrophy.   Impression foam cast was taken. ABN signed.  Patient info-  Shoe size: 8  Shoe style: sneaker  Height: 5'2  Weight: 142  Insurance: [employer] / [employer]   Patient will be notified once orthotics arrive in office and reappoint for fitting at that time.

## 2024-08-30 ENCOUNTER — Telehealth: Payer: Self-pay | Admitting: Podiatry

## 2024-08-30 NOTE — Telephone Encounter (Signed)
 Patient states she was scanned for inserts and wants to know have they come in yet?

## 2024-09-06 ENCOUNTER — Emergency Department (HOSPITAL_BASED_OUTPATIENT_CLINIC_OR_DEPARTMENT_OTHER)

## 2024-09-06 ENCOUNTER — Emergency Department (HOSPITAL_BASED_OUTPATIENT_CLINIC_OR_DEPARTMENT_OTHER): Admitting: Radiology

## 2024-09-06 ENCOUNTER — Encounter (HOSPITAL_BASED_OUTPATIENT_CLINIC_OR_DEPARTMENT_OTHER): Payer: Self-pay | Admitting: Emergency Medicine

## 2024-09-06 ENCOUNTER — Emergency Department (HOSPITAL_BASED_OUTPATIENT_CLINIC_OR_DEPARTMENT_OTHER)
Admission: EM | Admit: 2024-09-06 | Discharge: 2024-09-06 | Disposition: A | Discharge status: Home / Self Care | Attending: Emergency Medicine | Admitting: Emergency Medicine

## 2024-09-06 ENCOUNTER — Other Ambulatory Visit: Payer: Self-pay

## 2024-09-06 DIAGNOSIS — Z79899 Other long term (current) drug therapy: Secondary | ICD-10-CM | POA: Diagnosis not present

## 2024-09-06 DIAGNOSIS — I771 Stricture of artery: Secondary | ICD-10-CM | POA: Diagnosis not present

## 2024-09-06 DIAGNOSIS — I2510 Atherosclerotic heart disease of native coronary artery without angina pectoris: Secondary | ICD-10-CM | POA: Diagnosis not present

## 2024-09-06 DIAGNOSIS — R079 Chest pain, unspecified: Secondary | ICD-10-CM | POA: Diagnosis not present

## 2024-09-06 DIAGNOSIS — K219 Gastro-esophageal reflux disease without esophagitis: Secondary | ICD-10-CM | POA: Diagnosis not present

## 2024-09-06 DIAGNOSIS — R072 Precordial pain: Secondary | ICD-10-CM | POA: Insufficient documentation

## 2024-09-06 DIAGNOSIS — I129 Hypertensive chronic kidney disease with stage 1 through stage 4 chronic kidney disease, or unspecified chronic kidney disease: Secondary | ICD-10-CM | POA: Insufficient documentation

## 2024-09-06 DIAGNOSIS — R0789 Other chest pain: Secondary | ICD-10-CM | POA: Diagnosis not present

## 2024-09-06 DIAGNOSIS — J479 Bronchiectasis, uncomplicated: Secondary | ICD-10-CM | POA: Diagnosis not present

## 2024-09-06 DIAGNOSIS — R1013 Epigastric pain: Secondary | ICD-10-CM | POA: Insufficient documentation

## 2024-09-06 DIAGNOSIS — N189 Chronic kidney disease, unspecified: Secondary | ICD-10-CM | POA: Diagnosis not present

## 2024-09-06 DIAGNOSIS — I700 Atherosclerosis of aorta: Secondary | ICD-10-CM | POA: Diagnosis not present

## 2024-09-06 DIAGNOSIS — R9389 Abnormal findings on diagnostic imaging of other specified body structures: Secondary | ICD-10-CM

## 2024-09-06 LAB — LIPASE, BLOOD: Lipase: 13 U/L (ref 11–51)

## 2024-09-06 LAB — HEPATIC FUNCTION PANEL
ALT: 18 U/L (ref 0–44)
AST: 27 U/L (ref 15–41)
Albumin: 4.6 g/dL (ref 3.5–5.0)
Alkaline Phosphatase: 121 U/L (ref 38–126)
Bilirubin, Direct: 0.2 mg/dL (ref 0.0–0.2)
Indirect Bilirubin: 0.3 mg/dL (ref 0.3–0.9)
Total Bilirubin: 0.5 mg/dL (ref 0.0–1.2)
Total Protein: 8.3 g/dL — ABNORMAL HIGH (ref 6.5–8.1)

## 2024-09-06 LAB — CBC
HCT: 38.6 % (ref 36.0–46.0)
Hemoglobin: 13.3 g/dL (ref 12.0–15.0)
MCH: 30.8 pg (ref 26.0–34.0)
MCHC: 34.5 g/dL (ref 30.0–36.0)
MCV: 89.4 fL (ref 80.0–100.0)
Platelets: 202 K/uL (ref 150–400)
RBC: 4.32 MIL/uL (ref 3.87–5.11)
RDW: 14.3 % (ref 11.5–15.5)
WBC: 10.1 K/uL (ref 4.0–10.5)
nRBC: 0 % (ref 0.0–0.2)

## 2024-09-06 LAB — TROPONIN T, HIGH SENSITIVITY
Troponin T High Sensitivity: 16 ng/L (ref 0–19)
Troponin T High Sensitivity: 15 ng/L (ref 0–19)

## 2024-09-06 LAB — BASIC METABOLIC PANEL WITH GFR
Anion gap: 12 (ref 5–15)
BUN: 15 mg/dL (ref 8–23)
CO2: 25 mmol/L (ref 22–32)
Calcium: 10.4 mg/dL — ABNORMAL HIGH (ref 8.9–10.3)
Chloride: 98 mmol/L (ref 98–111)
Creatinine, Ser: 0.8 mg/dL (ref 0.44–1.00)
GFR, Estimated: >60 mL/min (ref >60)
Glucose, Bld: 109 mg/dL — ABNORMAL HIGH (ref 70–99)
Potassium: 3.7 mmol/L (ref 3.5–5.1)
Sodium: 135 mmol/L (ref 135–145)

## 2024-09-06 MED ORDER — LIDOCAINE VISCOUS HCL 2 % MT SOLN
15.0000 mL | Freq: Once | OROMUCOSAL | Status: AC
  Administered 2024-09-06: 15 mL via ORAL
  Filled 2024-09-06: qty 15

## 2024-09-06 MED ORDER — ALUM & MAG HYDROXIDE-SIMETH 200-200-20 MG/5ML PO SUSP
30.0000 mL | Freq: Once | ORAL | Status: AC
  Administered 2024-09-06: 30 mL via ORAL
  Filled 2024-09-06: qty 30

## 2024-09-06 MED ORDER — FAMOTIDINE IN NACL 20-0.9 MG/50ML-% IV SOLN
20.0000 mg | Freq: Once | INTRAVENOUS | Status: AC
  Administered 2024-09-06: 20 mg via INTRAVENOUS
  Filled 2024-09-06: qty 50

## 2024-09-06 MED ORDER — PANTOPRAZOLE SODIUM 40 MG IV SOLR
40.0000 mg | Freq: Once | INTRAVENOUS | Status: AC
  Administered 2024-09-06: 40 mg via INTRAVENOUS
  Filled 2024-09-06: qty 10

## 2024-09-06 MED ORDER — ONDANSETRON HCL 4 MG/2ML IJ SOLN
4.0000 mg | Freq: Once | INTRAMUSCULAR | Status: AC
  Administered 2024-09-06: 4 mg via INTRAVENOUS
  Filled 2024-09-06: qty 2

## 2024-09-06 MED ORDER — IOHEXOL 350 MG/ML SOLN
100.0000 mL | Freq: Once | INTRAVENOUS | Status: AC | PRN
  Administered 2024-09-06: 76 mL via INTRAVENOUS

## 2024-09-06 NOTE — ED Provider Notes (Signed)
 [place] EMERGENCY DEPARTMENT AT [facility] Provider Note   CSN: [id number] Arrival date & time: 09/06/24  [id number]     Patient presents with: Chest Pain   Denise Hoffman is a 88 y.o. female.    Chest Pain Associated symptoms: cough, nausea and vomiting      88 year old female with medical history significant for anxiety, CAD, CKD, HTN, GERD presenting to the emergency department with epigastric and substernal chest pain that is burning in sensation and characterization with associated radiation to the back.  The patient states that she has had ongoing pain for the past week.  It worsened over the past 24 hours and she could not sleep at all last night.  It was worse when lying flat.  She describes a sensation of burning and reflux.  She is not currently on any outpatient GERD medications.  Additionally, she has had a dry cough for the past few weeks.  No fevers or chills.  He has not been taking Pepcid  for the past 2 months.  She endorses 9 out of 10 burning pain with associated NBNB emesis x 1 prior to arrival.  Prior to Admission medications   Medication Sig Start Date End Date Taking? Authorizing Provider  Alpha-Lipoic Acid 300 MG CAPS Take 600 mg by mouth daily in the afternoon. Patient not taking: Reported on 08/03/2024 02/20/22   [provider]  ALPRAZolam  (XANAX ) 0.5 MG tablet Take 0.5 mg by mouth 2 (two) times daily as needed for anxiety.    [provider]  amLODipine  (NORVASC ) 5 MG tablet Take 1 tablet (5 mg total) by mouth daily. 01/07/24   O'NealDarryle Ned, MD  estradiol  (ESTRACE ) 0.1 MG/GM vaginal cream Place 0.25 Applicatorfuls vaginally 2 (two) times a week.    [provider]  ferrous sulfate 325 (65 FE) MG tablet Take 325 mg by mouth in the morning. Patient not taking: Reported on 08/03/2024    [provider]  levothyroxine (SYNTHROID) 50 MCG tablet Take 50 mcg by mouth daily before breakfast. 02/07/20   [provider]  losartan  (COZAAR ) 100 MG tablet Take 100 mg by mouth in the morning.    [provider]  metoprolol  tartrate (LOPRESSOR ) 100 MG tablet Take 100 mg by mouth in the morning and at bedtime.    [provider]  ondansetron  (ZOFRAN -ODT) 4 MG disintegrating tablet Take 1 tablet (4 mg total) by mouth every 8 (eight) hours as needed for nausea or vomiting. Patient not taking: Reported on 08/03/2024 03/21/23   Tegeler, Lonni PARAS, MD  Polyethyl Glycol-Propyl Glycol (LUBRICANT EYE DROPS) 0.4-0.3 % SOLN Place 1 drop into both eyes daily.    [provider]  TRIMO-SAN 0.025-0.01 % GEL Place 1 Application vaginally 2 (two) times a week. 02/05/20   [provider]    Allergies: Augmentin [amoxicillin-pot clavulanate], Biaxin [clarithromycin], Linzess [linaclotide], Morphine, Nsaids, Prednisone, Sulfonamide derivatives, and Vioxx [rofecoxib]    Review of Systems  Respiratory:  Positive for cough.   Cardiovascular:  Positive for chest pain.  Gastrointestinal:  Positive for nausea and vomiting.  All other systems reviewed and are negative.   Updated Vital Signs BP 135/63   Pulse 65   Resp 20   Wt 64.9 kg   SpO2 95%   BMI 25.33 kg/m   Physical Exam Vitals and nursing note reviewed.  Constitutional:      General: She is not in acute distress.    Appearance: She is well-developed.  HENT:  Head: Normocephalic and atraumatic.  Eyes:     Conjunctiva/sclera: Conjunctivae normal.  Cardiovascular:     Rate and Rhythm: Normal rate and regular rhythm.     Heart sounds: No murmur heard. Pulmonary:     Effort: Pulmonary effort is normal. No respiratory distress.     Breath sounds: Normal breath sounds.  Abdominal:     Palpations: Abdomen is soft.     Tenderness: There is no abdominal tenderness.  Musculoskeletal:        General: No swelling.     Cervical back: Neck supple.  Skin:    General: Skin is warm and dry.     Capillary Refill:  Capillary refill takes less than 2 seconds.  Neurological:     Mental Status: She is alert.  Psychiatric:        Mood and Affect: Mood normal.     (all labs ordered are listed, but only abnormal results are displayed) Labs Reviewed  BASIC METABOLIC PANEL WITH GFR - Abnormal; Notable for the following components:      Result Value   Glucose, Bld 109 (*)    Calcium 10.4 (*)    All other components within normal limits  HEPATIC FUNCTION PANEL - Abnormal; Notable for the following components:   Total Protein 8.3 (*)    All other components within normal limits  CBC  LIPASE, BLOOD  TROPONIN T, HIGH SENSITIVITY    EKG: EKG Interpretation Date/Time:  Wednesday September 06 2024 05:51:50 EST Ventricular Rate:  81 PR Interval:  241 QRS Duration:  103 QT Interval:  370 QTC Calculation: 430 R Axis:   29  Text Interpretation: Sinus rhythm Prolonged PR interval Confirmed by Jerrol Agent ([id number]) on 09/06/2024 6:06:11 AM  Radiology: DG Chest 2 View Result Date: 09/06/2024 EXAM: 2 VIEW(S) XRAY OF THE CHEST 09/06/2024 06:19:00 AM COMPARISON: chest radiographs 03/21/2023 and earlier. CLINICAL HISTORY: 88 year old female with chest pain. FINDINGS: LUNGS AND PLEURA: No focal pulmonary opacity. No pleural effusion. No pneumothorax. HEART AND MEDIASTINUM: Mild chronic tortuosity of the thoracic aorta and mediastinal contours are stable, otherwise normal. Calcified aortic atherosclerosis in the chest. BONES AND SOFT TISSUES: No acute osseous abnormality. Calcified aortic atherosclerosis in the abdomen. Chronic upper abdominal surgical clips, probably cholecystectomy. IMPRESSION: 1. No acute cardiopulmonary abnormality. 2. Aortic atherosclerosis. Electronically signed by: Helayne Hurst MD 09/06/2024 06:28 AM EST RP Workstation: [id number]     Procedures   Medications Ordered in the ED  famotidine (PEPCID) IVPB 20 mg premix (20 mg Intravenous New Bag/Given 09/06/24 0625)  ondansetron  (ZOFRAN )  injection 4 mg (4 mg Intravenous Given 09/06/24 0619)  iohexol  (OMNIPAQUE ) 350 MG/ML injection 100 mL (76 mLs Intravenous Contrast Given 09/06/24 0645)                                    Medical Decision Making Amount and/or Complexity of Data Reviewed Labs: ordered. Radiology: ordered.  Risk Prescription drug management.    88 year old female with medical history significant for anxiety, CAD, CKD, HTN, GERD presenting to the emergency department with epigastric and substernal chest pain that is burning in sensation and characterization with associated radiation to the back.  The patient states that she has had ongoing pain for the past week.  It worsened over the past 24 hours and she could not sleep at all last night.  It was worse when lying flat.  She describes a sensation of  burning and reflux.  She is not currently on any outpatient GERD medications.  Additionally, she has had a dry cough for the past few weeks.  No fevers or chills.  He has not been taking Pepcid for the past 2 months.  She endorses 9 out of 10 burning pain with associated NBNB emesis x 1 prior to arrival.  On arrival, the patient was not tachycardic, not tachypneic, BP 159/81, saturating her percent on room air.  Patient presenting with chest pain that is been ongoing for the past week.  Worsened over the past 24 hours.  Does have a history of reflux and this feels the same.  However also has been having a dry cough.  Considered GERD as the most likely etiology of the patient's symptoms, also considered PE, less likely ACS, pneumonia, pneumothorax.  Considered pancreatitis, biliary disease.  Less likely aortic dissection given duration of symptoms.  Patient was administered IV Zofran  and Pepcid.  EKG: Sinus rhythm, ventricular rate 81, no acute ischemic changes  Chest x-ray: No acute cardiac or pulmonary abnormality, aortic atherosclerosis noted.  Labs: CBC without a leukocytosis or anemia, BMP unremarkable, hepatic  function panel generally unremarkable, lipase normal, initial cardiac troponin 16.  CTA PE study: Pending at time of signout.  Plan to follow-up results of PE study reassess the patient's symptoms, patient could benefit from GI cocktail if workup is negative.     Final diagnoses:  None    ED Discharge Orders     None          Jerrol Agent, MD 09/06/24 [phone]

## 2024-09-06 NOTE — Discharge Instructions (Signed)
 Denise Hoffman  Thank you for allowing us  to take care of you today.  You came to the Emergency Department today because you are having some burning pain in your abdomen reminiscent of your prior episodes of GERD.  Here in the emergency department we did labs and CTs.  We are not seeing any blood clots, pneumonia, and your heart numbers are negative, therefore your symptoms are not due to a heart attack.  You are feeling better after medications were reflux.  We do recommend that you take over-the-counter Prilosec (omeprazole ) which is a stronger antiacid known as a proton pump inhibitor.  You should take this daily for 2 weeks, and then follow-up with your PCP and gastroenterologist for further instructions on how to manage your GERD.  Additionally your CT did show some findings of possible chronic interstitial lung disease which is a chronic lung condition.  There is no emergent treatment that you need for this currently, however we are giving you a referral to follow-up with a pulmonologist (lung doctor) to further evaluate you for this condition.   To-Do: 1. Please follow-up with your primary doctor within 1 - 2 weeks / as soon as possible.   Please return to the Emergency Department or call 911 if you experience have worsening of your symptoms, or do not get better, new or different chest pain, shortness of breath, severe or significantly worsening pain, high fever, severe confusion, pass out or have any reason to think that you need emergency medical care.   We hope you feel better soon.   Department of Emergency Medicine [facility]

## 2024-09-06 NOTE — ED Provider Notes (Signed)
  [place] EMERGENCY DEPARTMENT AT [facility] Provider Assume Care Note I assumed care of Denise Hoffman on 09/06/2024 at 7 AM from Dr. Jerrol.   Briefly, Denise Hoffman is a 88 y.o. female who: PMHx: anxiety, CAD, CKD, HTN, GERD presenting  P/w epigastric and chest discomfort for 1 week, worsened overnight, also with dry cough and sharp pain Reminiscent of prior episodes of GERD, prior provider with low index of suspicion for dissection  Plan at the time of handoff: Follow-up CT PE study, if reassuring, consider GI cocktail   Please refer to the original provider's note for additional information regarding the care of Denise Hoffman.  Reassessment: I personally reassessed the patient: Patient complained of ongoing burning discomfort.   Vital Signs:  ED Triage Vitals  Encounter Vitals Group     BP 09/06/24 0548 (!) 159/81     Girls Systolic BP Percentile --      Girls Diastolic BP Percentile --      Boys Systolic BP Percentile --      Boys Diastolic BP Percentile --      Pulse Rate 09/06/24 0548 92     Resp 09/06/24 0600 16     Temp --      Temp src --      SpO2 09/06/24 0548 100 %     Weight 09/06/24 0549 143 lb (64.9 kg)     Height --      Head Circumference --      Peak Flow --      Pain Score 09/06/24 0549 9     Pain Loc --      Pain Education --      Exclude from Growth Chart --      Hemodynamics:  The patient is hemodynamically stable. Mental Status:  The patient is alert  Additional MDM: CT PE study without acute PE.  Patient does have chronic lung changes demonstrated on CT PE study.  Will refer to pulmonology outpatient, did discuss these findings with patient and loved one at bedside.  Maalox and Protonix  ordered.  Given patient had worsening of chest pain overnight, will obtain delta troponin, this was stable at 15.  On reevaluation after GI cocktail, patient with resolution of symptoms, therefore feel that most likely etiology of  symptoms is GERD.  Thus feel that patient is stable for discharge and outpatient management with PCP, gastroenterology, pulmonology.  Disposition: DISCHARGE: I believe that the patient is safe for discharge home with outpatient follow-up. Patient was informed of all pertinent physical exam, laboratory, and imaging findings including lung changes on CT that require pulmonology follow-up. Patient's suspected etiology of their symptom presentation was discussed with the patient and all questions were answered. We discussed following up with PCP, gastroenterology, pulmonology. I provided thorough ED return precautions. The patient feels safe and comfortable with this plan.   FREDRIK CANDIE Later, MD Emergency Medicine    Later Denise RAMAN, MD 09/06/24 [phone]

## 2024-09-06 NOTE — ED Triage Notes (Signed)
 Pt in with central burning cp that began last night, kept her awake all night. Pt states she has hx of reflux, used to take Pepcid but has stopped taking it x 2mos. She states pain radiates to back. +emesis x 1 PTA, 9/10 pain

## 2024-09-11 ENCOUNTER — Telehealth: Payer: Self-pay

## 2024-09-11 NOTE — Telephone Encounter (Signed)
 Orthotics are in. [place] patient.

## 2024-09-15 ENCOUNTER — Other Ambulatory Visit

## 2024-09-20 ENCOUNTER — Other Ambulatory Visit

## 2024-09-21 ENCOUNTER — Ambulatory Visit

## 2024-09-21 VITALS — BP 133/72 | HR 66 | Temp 97.5°F | Ht 63.0 in | Wt 148.0 lb

## 2024-09-21 DIAGNOSIS — I5189 Other ill-defined heart diseases: Secondary | ICD-10-CM

## 2024-09-21 DIAGNOSIS — M1990 Unspecified osteoarthritis, unspecified site: Secondary | ICD-10-CM

## 2024-09-21 DIAGNOSIS — G6289 Other specified polyneuropathies: Secondary | ICD-10-CM

## 2024-09-21 DIAGNOSIS — J849 Interstitial pulmonary disease, unspecified: Secondary | ICD-10-CM

## 2024-09-21 DIAGNOSIS — R600 Localized edema: Secondary | ICD-10-CM

## 2024-09-21 LAB — BRAIN NATRIURETIC PEPTIDE: Pro B Natriuretic peptide (BNP): 83 pg/mL (ref 1.0–100.0)

## 2024-09-21 NOTE — Progress Notes (Signed)
 New Patient Pulmonology Office Visit   Subjective:  Patient ID: Denise Hoffman, female    DOB: Apr 01, 1936  MRN: [id number]  Referred by: Rogelia Jerilynn RAMAN, MD  CC:  Chief Complaint  Patient presents with   Consult    ED-Abnormal ct. Patient denies any breathing issues.    HPI Denise Hoffman is a 88 y.o. female who is referred for abnormal ct chest.  Discussed the use of AI scribe software for clinical note transcription with the patient, who gave verbal consent to proceed.  History of Present Illness Denise Hoffman is an 89 year old female who presents for abnormal ct chest.  She initially sought medical attention due to severe GERD symptoms, which led to a CT scan in 09/2024 to rule out a heart attack. She has no history of severe respiratory infections, COVID-19, or pneumonia. She has never smoked and worked in a school [employer]. She occasionally experiences a dry cough but has no shortness of breath.  She experiences swelling in her legs and mentions a past diagnosis of possible neuropathy in her feet from 2019, which causes significant pain. She has not received a definitive diagnosis or effective treatment for this condition. She has tried gabapentin without relief. She underwent an echocardiogram in 2021 which showed . No history of congestive heart failure.  She experiences arthritis symptoms, particularly in her hands, which have worsened over the past year. She reports difficulty gripping with her hand and pain that persists throughout the day and night, sometimes waking her up. She takes Tylenol  for pain relief and receives annual steroid injections for knee pain. She has had a hip replacement. No family history of rheumatoid arthritis and has not been diagnosed with any rheumatological diseases like lupus. She is interested in testing for rheumatoid arthritis.       ROS  Allergies: Augmentin [amoxicillin-pot clavulanate], Biaxin [clarithromycin],  Linzess [linaclotide], Morphine, Nsaids, Prednisone, Sulfonamide derivatives, and Vioxx [rofecoxib] Current Medications[1] Past Medical History:  Diagnosis Date   Anxiety    Arthritis    knees and fingers   Chronic kidney disease    stage 3   Coronary artery disease    Headache    Hernia    Hypertension    Hypothyroidism    Pancreatic cyst    Sciatica    Past Surgical History:  Procedure Laterality Date   CATARACT EXTRACTION  2022   COLONOSCOPY  01/30/2012   Procedure: COLONOSCOPY;  Surgeon: Lamar JONETTA Aho, MD;  Location: [facility];  Service: Endoscopy;  Laterality: N/A;   ESOPHAGOGASTRODUODENOSCOPY  01/28/2012   Procedure: ESOPHAGOGASTRODUODENOSCOPY (EGD);  Surgeon: Lamar JONETTA Aho, MD;  Location: [facility];  Service: Endoscopy;  Laterality: N/A;   FOOT SURGERY Right    pt thinks it was the right foot, unsure. Cyst removal   HAND SURGERY Left    HERNIA REPAIR     lvh with 12 cm parietex   WHIPPLE PROCEDURE     gall bladder removed with procedure   Family History  Problem Relation Age of Onset   Stroke Mother    Breast cancer Sister    Colon cancer Brother    Social History   Socioeconomic History   Marital status: Married    Spouse name: Not on file   Number of children: 1   Years of education: Not on file   Highest education level: Not on file  Occupational History   Occupation: Retired    Associate Professor: RETIRED  Tobacco Use  Smoking status: Never   Smokeless tobacco: Never  Vaping Use   Vaping status: Never Used  Substance and Sexual Activity   Alcohol use: No   Drug use: No   Sexual activity: Not on file  Other Topics Concern   Not on file  Social History Narrative   Not on file   Social Drivers of Health   Tobacco Use: Low Risk (09/21/2024)   Patient History    Smoking Tobacco Use: Never    Smokeless Tobacco Use: Never    Passive Exposure: Not on file  Financial Resource Strain: Not on file  Food Insecurity: Not on file  Transportation  Needs: Not on file  Physical Activity: Not on file  Stress: Not on file  Social Connections: Not on file  Intimate Partner Violence: Not on file  Depression (EYV7-0): Not on file  Alcohol Screen: Not on file  Housing: Not on file  Utilities: Not on file  Health Literacy: Not on file         Objective:  BP 133/72   Pulse 66   Temp (!) 97.5 F (36.4 C) (Oral)   Ht 5' 3 (1.6 m)   Wt 148 lb (67.1 kg)   SpO2 96%   BMI 26.22 kg/m    Physical Exam  Diagnostic Review:    Pft     No data to display               Results Echo 05/2020 1. Left ventricular ejection fraction, by estimation, is 60 to 65%. The  left ventricle has normal function. The left ventricle has no regional  wall motion abnormalities. Left ventricular diastolic parameters are  consistent with Grade I diastolic  dysfunction (impaired relaxation).   2. Right ventricular systolic function is normal. The right ventricular  size is normal. Tricuspid regurgitation signal is inadequate for assessing  PA pressure.   3. The mitral valve is normal in structure. Mild mitral valve  regurgitation. No evidence of mitral stenosis.   4. The aortic valve is tricuspid. Aortic valve regurgitation is mild.  Mild aortic valve sclerosis is present, with no evidence of aortic valve  stenosis.   5. The inferior vena cava is normal in size with greater than 50%  respiratory variability, suggesting right atrial pressure of 3 mmHg.     Radiology Chest CT with contrast (09/2024): Basilar and dependent pulmonary scarring and haziness; minimal progression compared to prior studies; possible dependent atelectasis or mild pulmonary edema (Independently interpreted) Cardiac CT for coronary calcium (2023): Scarring at lung bases, stable compared to more recent imaging (Independently interpreted) Abdominal radiograph (2023): Scarring at lung bases (Independently interpreted)  Diagnostic Echocardiogram (2021): Diastolic  dysfunction      Assessment & Plan:   Assessment & Plan ILD (interstitial lung disease) (HCC)  Orders:   CT CHEST HIGH RESOLUTION; Future   ANA   Rheumatoid Factor; Future   Cyclic citrul peptide antibody, IgG; Future   ANA+ENA+DNA/DS+Scl 70+SjoSSA/B   Pulmonary function test; Future  Arthritis  Orders:   CT CHEST HIGH RESOLUTION; Future   ANA   Rheumatoid Factor; Future   Cyclic citrul peptide antibody, IgG; Future   ANA+ENA+DNA/DS+Scl 70+SjoSSA/B  Pedal edema  Orders:   B Nat Peptide; Future   Pulmonary function test; Future  Diastolic dysfunction  Orders:   B Nat Peptide; Future  Other polyneuropathy       Thank you for the opportunity to take part in the care of SYLVIE MIFSUD   Return  in about 6 months (around 03/26/2025).   Jayline Kilburg Pleas, MD [facility] Office: [phone]     [1]  Current Outpatient Medications:    ALPRAZolam  (XANAX ) 0.5 MG tablet, Take 0.5 mg by mouth 2 (two) times daily as needed for anxiety., Disp: , Rfl:    amLODipine  (NORVASC ) 5 MG tablet, Take 1 tablet (5 mg total) by mouth daily., Disp: 90 tablet, Rfl: 0   estradiol  (ESTRACE ) 0.1 MG/GM vaginal cream, Place 0.25 Applicatorfuls vaginally 2 (two) times a week., Disp: , Rfl:    levothyroxine (SYNTHROID) 50 MCG tablet, Take 50 mcg by mouth daily before breakfast., Disp: , Rfl:    losartan  (COZAAR ) 100 MG tablet, Take 100 mg by mouth in the morning., Disp: , Rfl:    metoprolol  tartrate (LOPRESSOR ) 100 MG tablet, Take 100 mg by mouth in the morning and at bedtime., Disp: , Rfl:    Omeprazole  20 MG TBEC, 1 tablet 1/2 to 1 hour before morning meal Orally Once a day; Duration: 30 days, Disp: , Rfl:    Polyethyl Glycol-Propyl Glycol (LUBRICANT EYE DROPS) 0.4-0.3 % SOLN, Place 1 drop into both eyes daily., Disp: , Rfl:    TRIMO-SAN 0.025-0.01 % GEL, Place 1 Application vaginally 2 (two) times a week., Disp: , Rfl:    Alpha-Lipoic Acid 300 MG CAPS, Take 600 mg  by mouth daily in the afternoon. (Patient not taking: Reported on 09/21/2024), Disp: , Rfl:    ferrous sulfate 325 (65 FE) MG tablet, Take 325 mg by mouth in the morning. (Patient not taking: Reported on 09/21/2024), Disp: , Rfl:    ondansetron  (ZOFRAN -ODT) 4 MG disintegrating tablet, Take 1 tablet (4 mg total) by mouth every 8 (eight) hours as needed for nausea or vomiting. (Patient not taking: Reported on 09/21/2024), Disp: 20 tablet, Rfl: 0

## 2024-09-21 NOTE — Patient Instructions (Signed)
°  VISIT SUMMARY: During your visit, we discussed your concerns about lung scarring and leg swelling. We reviewed your history of GERD, neuropathy, and arthritis, and planned further tests to understand your condition better.  YOUR PLAN: PULMONARY FIBROSIS: You have minimal scarring at the lung bases and ground glass opacities at bases. This could be due to pulmonary fibrosis or past infections or exposures or fluid in lungs and poor expansion  -We ordered rheumatological labs to check for any underlying rheumatological disease. -We ordered a pulmonary function test to assess your lung capacity. -We will schedule a follow-up CT scan in six months to check for any progression of the scarring. -We advised you to reduce your dietary salt intake to help manage potential fluid retention.  DIASTOLIC HEART FAILURE: You have diastolic dysfunction noted on your echocardiogram from 2021, which could contribute to pulmonary congestion and leg swelling. -We advised you to reduce your dietary salt intake to help manage fluid retention.      Contains text generated by [employer].

## 2024-09-22 LAB — ANA: Anti Nuclear Antibody (ANA): NEGATIVE

## 2024-09-23 LAB — ANA+ENA+DNA/DS+SCL 70+SJOSSA/B
ANA Titer 1: NEGATIVE
ENA RNP Ab: 0.3 AI (ref 0.0–0.9)
ENA SM Ab Ser-aCnc: <0.2 AI (ref 0.0–0.9)
ENA SSA (RO) Ab: <0.2 AI (ref 0.0–0.9)
ENA SSB (LA) Ab: <0.2 AI (ref 0.0–0.9)
Scleroderma (Scl-70) (ENA) Antibody, IgG: <0.2 AI (ref 0.0–0.9)
dsDNA Ab: <1 [IU]/mL (ref 0–9)

## 2024-09-23 LAB — CYCLIC CITRUL PEPTIDE ANTIBODY, IGG: Cyclic Citrullin Peptide Ab: <16 U

## 2024-09-23 LAB — RHEUMATOID FACTOR: Rheumatoid fact SerPl-aCnc: <10 [IU]/mL

## 2024-09-25 ENCOUNTER — Other Ambulatory Visit

## 2024-10-06 ENCOUNTER — Other Ambulatory Visit

## 2024-10-10 ENCOUNTER — Emergency Department (HOSPITAL_BASED_OUTPATIENT_CLINIC_OR_DEPARTMENT_OTHER): Admitting: Radiology

## 2024-10-10 ENCOUNTER — Emergency Department (HOSPITAL_BASED_OUTPATIENT_CLINIC_OR_DEPARTMENT_OTHER)
Admission: EM | Admit: 2024-10-10 | Discharge: 2024-10-10 | Disposition: A | Discharge status: Home / Self Care | Attending: Emergency Medicine | Admitting: Emergency Medicine

## 2024-10-10 ENCOUNTER — Emergency Department (HOSPITAL_BASED_OUTPATIENT_CLINIC_OR_DEPARTMENT_OTHER)

## 2024-10-10 ENCOUNTER — Encounter (HOSPITAL_BASED_OUTPATIENT_CLINIC_OR_DEPARTMENT_OTHER): Payer: Self-pay | Admitting: *Deleted

## 2024-10-10 ENCOUNTER — Other Ambulatory Visit: Payer: Self-pay

## 2024-10-10 DIAGNOSIS — M7989 Other specified soft tissue disorders: Secondary | ICD-10-CM | POA: Insufficient documentation

## 2024-10-10 DIAGNOSIS — M25572 Pain in left ankle and joints of left foot: Secondary | ICD-10-CM | POA: Insufficient documentation

## 2024-10-10 DIAGNOSIS — M79672 Pain in left foot: Secondary | ICD-10-CM

## 2024-10-10 DIAGNOSIS — Z79899 Other long term (current) drug therapy: Secondary | ICD-10-CM | POA: Diagnosis not present

## 2024-10-10 LAB — CBC WITH DIFFERENTIAL/PLATELET
Abs Immature Granulocytes: 0.04 K/uL (ref 0.00–0.07)
Basophils Absolute: 0 K/uL (ref 0.0–0.1)
Basophils Relative: 0 %
Eosinophils Absolute: 0.1 K/uL (ref 0.0–0.5)
Eosinophils Relative: 1 %
HCT: 38.5 % (ref 36.0–46.0)
Hemoglobin: 13.2 g/dL (ref 12.0–15.0)
Immature Granulocytes: 1 %
Lymphocytes Relative: 24 %
Lymphs Abs: 1.7 K/uL (ref 0.7–4.0)
MCH: 31.2 pg (ref 26.0–34.0)
MCHC: 34.3 g/dL (ref 30.0–36.0)
MCV: 91 fL (ref 80.0–100.0)
Monocytes Absolute: 1.1 K/uL — ABNORMAL HIGH (ref 0.1–1.0)
Monocytes Relative: 15 %
Neutro Abs: 4.2 K/uL (ref 1.7–7.7)
Neutrophils Relative %: 59 %
Platelets: 198 K/uL (ref 150–400)
RBC: 4.23 MIL/uL (ref 3.87–5.11)
RDW: 15 % (ref 11.5–15.5)
WBC: 7 K/uL (ref 4.0–10.5)
nRBC: 0 % (ref 0.0–0.2)

## 2024-10-10 LAB — BASIC METABOLIC PANEL WITH GFR
Anion gap: 12 (ref 5–15)
BUN: 19 mg/dL (ref 8–23)
CO2: 25 mmol/L (ref 22–32)
Calcium: 10.7 mg/dL — ABNORMAL HIGH (ref 8.9–10.3)
Chloride: 98 mmol/L (ref 98–111)
Creatinine, Ser: 0.98 mg/dL (ref 0.44–1.00)
GFR, Estimated: 56 mL/min — ABNORMAL LOW
Glucose, Bld: 140 mg/dL — ABNORMAL HIGH (ref 70–99)
Potassium: 4.1 mmol/L (ref 3.5–5.1)
Sodium: 136 mmol/L (ref 135–145)

## 2024-10-10 LAB — PROTIME-INR
INR: 0.9 (ref 0.8–1.2)
Prothrombin Time: 13 s (ref 11.4–15.2)

## 2024-10-10 MED ORDER — HYDROCODONE-ACETAMINOPHEN 5-325 MG PO TABS: 1.0000 | ORAL_TABLET | Freq: Four times a day (QID) | ORAL | 0 refills | Status: AC | PRN

## 2024-10-10 MED ORDER — HYDROCODONE-ACETAMINOPHEN 5-325 MG PO TABS
1.0000 | ORAL_TABLET | Freq: Once | ORAL | Status: AC
  Administered 2024-10-10: 1 via ORAL
  Filled 2024-10-10: qty 1

## 2024-10-10 MED ORDER — CEPHALEXIN 250 MG PO CAPS
500.0000 mg | ORAL_CAPSULE | Freq: Once | ORAL | Status: AC
  Administered 2024-10-10: 500 mg via ORAL
  Filled 2024-10-10: qty 2

## 2024-10-10 MED ORDER — ONDANSETRON 4 MG PO TBDP
8.0000 mg | ORAL_TABLET | Freq: Once | ORAL | Status: AC
  Administered 2024-10-10: 8 mg via ORAL
  Filled 2024-10-10: qty 2

## 2024-10-10 MED ORDER — CEPHALEXIN 500 MG PO CAPS: 500.0000 mg | ORAL_CAPSULE | Freq: Four times a day (QID) | ORAL | 0 refills | Status: AC

## 2024-10-10 NOTE — ED Provider Triage Note (Signed)
 Emergency Medicine Provider Triage Evaluation Note  HELLON VACCARELLA , a 89 y.o. female  was evaluated in triage.  Pt complains of bilateral leg pain and swelling, left worse than right, onset today without injury. Sudden onset, looked at legs and noted bruising to the front of her left lower leg. Not on thinners.   Review of Systems  Positive:  Negative:   Physical Exam  BP (!) 144/66   Pulse 77   Temp 98.1 F (36.7 C) (Oral)   Resp 14   SpO2 98%  Gen:   Awake, no distress   Resp:  Normal effort  MSK:   Moves extremities without difficulty  Other:    Medical Decision Making  Medically screening exam initiated at 3:38 PM.  Appropriate orders placed.  JERNEE MURTAUGH was informed that the remainder of the evaluation will be completed by another provider, this initial triage assessment does not replace that evaluation, and the importance of remaining in the ED until their evaluation is complete.     Beverley Leita LABOR, PA-C 10/10/24 1601

## 2024-10-10 NOTE — Discharge Instructions (Addendum)
 Evaluation of your left foot pain was overall reassuring but I am concerned that there may be infection developing.  I am starting you on Keflex  for treatment.  Please follow-up with your orthopedic doctor as well as there was concern for a masslike lesion in your left foot versus a collection of fluid.  Also sent a few tablets of Norco to your pharmacy for pain.

## 2024-10-10 NOTE — ED Triage Notes (Signed)
 Pt has pain and swelling to left foot, ankle and lower legs.  Pt has some bruising.  Pt denies any injury or fall.  No recent travel.

## 2024-10-10 NOTE — ED Provider Notes (Signed)
 " [place] EMERGENCY DEPARTMENT AT [facility] Provider Note   CSN: [id number] Arrival date & time: 10/10/24  1505     Patient presents with: Leg Pain  HPI Denise Hoffman is a 89 y.o. female presenting for left foot ankle and lower leg swelling and pain.  She states she noticed some bruising in her leg this afternoon along with her symptoms.  She denies any trauma or fall.  Denies any recent travel.  Is not on a blood thinner at this time.  Denies chest pain or shortness of breath.  She states the bruised area just around her ankle is very tender to touch.  Pain is worse with ambulation.  Still able to ambulate with a walking cane.    Leg Pain      Prior to Admission medications  Medication Sig Start Date End Date Taking? Authorizing Provider  cephALEXin  (KEFLEX ) 500 MG capsule Take 1 capsule (500 mg total) by mouth 4 (four) times daily for 5 days. 10/10/24 10/15/24 Yes Blyss Lugar K, PA-C  HYDROcodone -acetaminophen  (NORCO/VICODIN) 5-325 MG tablet Take 1 tablet by mouth every 6 (six) hours as needed for up to 8 doses. 10/10/24  Yes Cordarious Zeek K, PA-C  Alpha-Lipoic Acid 300 MG CAPS Take 600 mg by mouth daily in the afternoon. Patient not taking: Reported on 09/21/2024 02/20/22   [provider]  ALPRAZolam  (XANAX ) 0.5 MG tablet Take 0.5 mg by mouth 2 (two) times daily as needed for anxiety.    [provider]  amLODipine  (NORVASC ) 5 MG tablet Take 1 tablet (5 mg total) by mouth daily. 01/07/24   O'NealDarryle Ned, MD  estradiol  (ESTRACE ) 0.1 MG/GM vaginal cream Place 0.25 Applicatorfuls vaginally 2 (two) times a week.    [provider]  ferrous sulfate 325 (65 FE) MG tablet Take 325 mg by mouth in the morning. Patient not taking: Reported on 09/21/2024    [provider]  levothyroxine (SYNTHROID) 50 MCG tablet Take 50 mcg by mouth daily before breakfast. 02/07/20   [provider]  losartan  (COZAAR ) 100 MG tablet Take 100 mg  by mouth in the morning.    [provider]  metoprolol  tartrate (LOPRESSOR ) 100 MG tablet Take 100 mg by mouth in the morning and at bedtime.    [provider]  Omeprazole  20 MG TBEC 1 tablet 1/2 to 1 hour before morning meal Orally Once a day; Duration: 30 days 09/13/24   [provider]  ondansetron  (ZOFRAN -ODT) 4 MG disintegrating tablet Take 1 tablet (4 mg total) by mouth every 8 (eight) hours as needed for nausea or vomiting. Patient not taking: Reported on 09/21/2024 03/21/23   Tegeler, Lonni PARAS, MD  Polyethyl Glycol-Propyl Glycol (LUBRICANT EYE DROPS) 0.4-0.3 % SOLN Place 1 drop into both eyes daily.    [provider]  TRIMO-SAN 0.025-0.01 % GEL Place 1 Application vaginally 2 (two) times a week. 02/05/20   [provider]    Allergies: Augmentin [amoxicillin-pot clavulanate], Biaxin [clarithromycin], Linzess [linaclotide], Morphine, Nsaids, Prednisone, Sulfonamide derivatives, and Vioxx [rofecoxib]    Review of Systems See HPI   Physical Exam   Vitals:   10/10/24 1845 10/10/24 2004  BP: (!) 157/66   Pulse: 67   Resp: 16   Temp:  97.8 F (36.6 C)  SpO2: 97%     CONSTITUTIONAL:  well-appearing, NAD NEURO:  Alert and oriented x 3, CN 3-12 grossly intact EYES:  eyes equal and reactive ENT/NECK:  Supple, no stridor  CARDIO:  regular rate and rhythm, appears well-perfused  PULM:  No respiratory distress, CTAB GI/GU:  non-distended MSK/SPINE:  No gross deformities, no edema, moves all extremities, ecchymosis noted about the anterior left ankle with tenderness, it is hot to touch and swelling and erythema noted as well.  Able to ambulate and bear weight without a walking cane.  No tenderness with palpation of the calf.  SKIN:  no rash, atraumatic  *Additional and/or pertinent findings included in MDM below    (all labs ordered are listed, but only abnormal results are displayed) Labs Reviewed  BASIC METABOLIC PANEL WITH GFR -  Abnormal; Notable for the following components:      Result Value   Glucose, Bld 140 (*)    Calcium 10.7 (*)    GFR, Estimated 56 (*)    All other components within normal limits  CBC WITH DIFFERENTIAL/PLATELET - Abnormal; Notable for the following components:   Monocytes Absolute 1.1 (*)    All other components within normal limits  PROTIME-INR    EKG: None  Radiology: US  Venous Img Lower  Left (DVT Study) Result Date: 10/10/2024 CLINICAL DATA:  Pain and swelling EXAM: Left LOWER EXTREMITY VENOUS DOPPLER ULTRASOUND TECHNIQUE: Gray-scale sonography with compression, as well as color and duplex ultrasound, were performed to evaluate the deep venous system(s) from the level of the common femoral vein through the popliteal and proximal calf veins. COMPARISON:  None Available. FINDINGS: VENOUS Normal compressibility of the common femoral, superficial femoral, and popliteal veins, as well as the visualized calf veins. Visualized portions of profunda femoral vein and great saphenous vein unremarkable. No filling defects to suggest DVT on grayscale or color Doppler imaging. Doppler waveforms show normal direction of venous flow, normal respiratory plasticity and response to augmentation. Limited views of the contralateral common femoral vein are unremarkable. OTHER Hypoechoic masslike area at the anterior ankle measuring 4.8 x 1.2 x 3 cm, without significant internal vascularity. Limitations: none IMPRESSION: 1. Negative for acute left lower extremity DVT. 2. 4.8 cm hypoechoic masslike area at the anterior ankle, indeterminate for complex fluid collection or hypovascular soft tissue mass. Correlate for any history of trauma to the region. Consider MRI for further evaluation. Electronically Signed   By: Luke Bun M.D.   On: 10/10/2024 19:57   DG Tibia/Fibula Left Result Date: 10/10/2024 CLINICAL DATA:  Swelling and bruising, pain EXAM: LEFT TIBIA AND FIBULA - 2 VIEW COMPARISON:  None Available.  FINDINGS: Frontal and lateral views of the left tibia and fibula are obtained. No acute displaced fracture. Alignment is anatomic. Mild osteoarthritis of the left knee and ankle. Mild diffuse subcutaneous edema greatest distally. IMPRESSION: 1. Diffuse subcutaneous edema, greatest distally. 2. No acute bony abnormalities. 3. Osteoarthritis of the left knee and ankle. Electronically Signed   By: Ozell Daring M.D.   On: 10/10/2024 16:46     Procedures   Medications Ordered in the ED  cephALEXin  (KEFLEX ) capsule 500 mg (500 mg Oral Given 10/10/24 1831)  ondansetron  (ZOFRAN -ODT) disintegrating tablet 8 mg (8 mg Oral Given 10/10/24 1831)  HYDROcodone -acetaminophen  (NORCO/VICODIN) 5-325 MG per tablet 1 tablet (1 tablet Oral Given 10/10/24 1831)                                    Medical Decision Making Risk Prescription drug management.   89 year old well-appearing female presenting for left foot pain and swelling.  Exam notable for bruising, mild erythema and  edema primarily originating about the anterior aspect of the left ankle extending to the posterior.  Able to ambulate bear weight with a cane.  X-ray showing diffuse subcutaneous edema but no acute osseous abnormality.  Ultrasound was negative for DVT but did show 4.8 cm hypoechoic masslike area indeterminate for complex fluid collection or hypovascular soft tissue mass.  Also had Dr. Bari evaluate patient at the bedside.  At this time we decided that I&D not indicated and patient does appear to have close follow-up with orthopedics.  Started her on Keflex  for what could be developing cellulitis and advised her to follow-up with orthopedics and her PCP.  Discussed return precautions.  Discharged good condition.     Final diagnoses:  Left foot pain    ED Discharge Orders          Ordered    cephALEXin  (KEFLEX ) 500 MG capsule  4 times daily        10/10/24 2056    HYDROcodone -acetaminophen  (NORCO/VICODIN) 5-325 MG tablet  Every 6 hours  PRN        10/10/24 2103               Lakeidra Reliford K, PA-C 10/10/24 2105  "

## 2024-10-23 ENCOUNTER — Ambulatory Visit: Admitting: Podiatry

## 2024-11-02 ENCOUNTER — Encounter (INDEPENDENT_AMBULATORY_CARE_PROVIDER_SITE_OTHER): Admitting: Ophthalmology

## 2024-11-16 ENCOUNTER — Encounter (INDEPENDENT_AMBULATORY_CARE_PROVIDER_SITE_OTHER): Admitting: Ophthalmology

## 2025-03-22 ENCOUNTER — Other Ambulatory Visit

## 2025-03-23 ENCOUNTER — Encounter

## 2025-03-23 ENCOUNTER — Ambulatory Visit
